# Patient Record
Sex: Female | Born: 1980 | Race: White | Hispanic: No | Marital: Married | Smoking: Never smoker
Health system: Southern US, Community
[De-identification: ages and names within clinical notes are randomized; demographics above are authoritative.]

## PROBLEM LIST (undated history)

## (undated) DIAGNOSIS — Z9289 Personal history of other medical treatment: Secondary | ICD-10-CM

## (undated) DIAGNOSIS — Z8742 Personal history of other diseases of the female genital tract: Secondary | ICD-10-CM

## (undated) DIAGNOSIS — Z6791 Unspecified blood type, Rh negative: Secondary | ICD-10-CM

## (undated) DIAGNOSIS — Z803 Family history of malignant neoplasm of breast: Secondary | ICD-10-CM

## (undated) DIAGNOSIS — N6019 Diffuse cystic mastopathy of unspecified breast: Secondary | ICD-10-CM

## (undated) DIAGNOSIS — N970 Female infertility associated with anovulation: Secondary | ICD-10-CM

## (undated) DIAGNOSIS — O26899 Other specified pregnancy related conditions, unspecified trimester: Secondary | ICD-10-CM

## (undated) DIAGNOSIS — N63 Unspecified lump in unspecified breast: Secondary | ICD-10-CM

## (undated) HISTORY — DX: Unspecified blood type, rh negative: Z67.91

## (undated) HISTORY — DX: Family history of malignant neoplasm of breast: Z80.3

## (undated) HISTORY — DX: Diffuse cystic mastopathy of unspecified breast: N60.19

## (undated) HISTORY — DX: Female infertility associated with anovulation: N97.0

## (undated) HISTORY — PX: NO PAST SURGERIES: SHX2092

## (undated) HISTORY — DX: Unspecified lump in unspecified breast: N63.0

## (undated) HISTORY — PX: WISDOM TOOTH EXTRACTION: SHX21

## (undated) HISTORY — DX: Personal history of other medical treatment: Z92.89

## (undated) HISTORY — DX: Other specified pregnancy related conditions, unspecified trimester: O26.899

## (undated) HISTORY — DX: Personal history of other diseases of the female genital tract: Z87.42

---

## 2008-04-15 ENCOUNTER — Encounter: Payer: Self-pay | Admitting: Maternal & Fetal Medicine

## 2010-08-29 ENCOUNTER — Ambulatory Visit: Payer: Self-pay | Admitting: Obstetrics and Gynecology

## 2011-07-03 DIAGNOSIS — N63 Unspecified lump in unspecified breast: Secondary | ICD-10-CM

## 2011-07-03 HISTORY — DX: Unspecified lump in unspecified breast: N63.0

## 2011-07-24 ENCOUNTER — Inpatient Hospital Stay: Payer: Self-pay

## 2011-07-24 LAB — CBC WITH DIFFERENTIAL/PLATELET
Basophil %: 0 %
Eosinophil #: 0 10*3/uL (ref 0.0–0.7)
Lymphocyte #: 1.2 10*3/uL (ref 1.0–3.6)
MCH: 31.8 pg (ref 26.0–34.0)
MCHC: 33.2 g/dL (ref 32.0–36.0)
MCV: 96 fL (ref 80–100)
Monocyte #: 0.7 10*3/uL (ref 0.0–0.7)
Platelet: 128 10*3/uL — ABNORMAL LOW (ref 150–440)
RBC: 3.71 10*6/uL — ABNORMAL LOW (ref 3.80–5.20)

## 2013-02-26 ENCOUNTER — Ambulatory Visit (INDEPENDENT_AMBULATORY_CARE_PROVIDER_SITE_OTHER): Payer: BC Managed Care – PPO | Admitting: General Surgery

## 2013-02-26 ENCOUNTER — Encounter: Payer: Self-pay | Admitting: General Surgery

## 2013-02-26 VITALS — BP 138/82 | HR 88 | Resp 12 | Ht 67.0 in | Wt 156.0 lb

## 2013-02-26 DIAGNOSIS — N6019 Diffuse cystic mastopathy of unspecified breast: Secondary | ICD-10-CM | POA: Insufficient documentation

## 2013-02-26 DIAGNOSIS — Z872 Personal history of diseases of the skin and subcutaneous tissue: Secondary | ICD-10-CM

## 2013-02-26 NOTE — Progress Notes (Signed)
Patient ID: Isabel Humphrey, female   DOB: Feb 23, 1981, 32 y.o.   MRN: [id number]  Chief Complaint  Patient presents with  . Follow-up    mammogram    HPI Isabel Humphrey is a 32 y.o. female who presents for a breast evaluation. Patient no new breast problems. Patient does perform regular self breast checks   HPI  Past Medical History  Diagnosis Date  . Diffuse cystic mastopathy     History reviewed. No pertinent past surgical history.  History reviewed. No pertinent family history.  Social History History  Substance Use Topics  . Smoking status: Never Smoker   . Smokeless tobacco: Never Used  . Alcohol Use: Yes    No Known Allergies  Current Outpatient Prescriptions  Medication Sig Dispense Refill  . fish oil-omega-3 fatty acids 1000 MG capsule Take 2 g by mouth daily.      Marland Kitchen LEVORA 0.15/30, 28, 0.15-30 MG-MCG tablet Take 1 tablet by mouth daily.      . Multiple Vitamins-Minerals (MULTIVITAMIN WITH MINERALS) tablet Take 1 tablet by mouth daily.       No current facility-administered medications for this visit.    Review of Systems Review of Systems  Constitutional: Negative.   Respiratory: Negative.   Cardiovascular: Negative.     Blood pressure 138/82, pulse 88, resp. rate 12, height 5\' 7"  (1.702 m), weight 156 lb (70.761 kg), last menstrual period 02/05/2013.  Physical Exam Physical Exam  Constitutional: She is oriented to person, place, and time. She appears well-developed and well-nourished.  Eyes: No scleral icterus.  Neck: Neck supple. No mass present.  Cardiovascular: Normal rate, regular rhythm and normal heart sounds.   Pulmonary/Chest: Breath sounds normal. Right breast exhibits no inverted nipple, no mass, no nipple discharge, no skin change and no tenderness. Left breast exhibits no inverted nipple, no mass, no nipple discharge, no skin change and no tenderness.  Abdominal: Bowel sounds are normal.  Lymphadenopathy:    She has no cervical  adenopathy.    She has no axillary adenopathy.  Neurological: She is alert and oriented to person, place, and time.  Skin: Skin is warm and dry.    Data Reviewed None   Assessment    Stable exam.    Plan      Patient to return one year.    Zeus Marquis G 02/27/2013, 6:28 AM

## 2013-02-26 NOTE — Patient Instructions (Addendum)
Patient to return in one year. Advised on monthly self exam.

## 2013-02-27 ENCOUNTER — Encounter: Payer: Self-pay | Admitting: General Surgery

## 2014-03-01 ENCOUNTER — Ambulatory Visit (INDEPENDENT_AMBULATORY_CARE_PROVIDER_SITE_OTHER): Payer: BC Managed Care – PPO | Admitting: General Surgery

## 2014-03-01 ENCOUNTER — Encounter: Payer: Self-pay | Admitting: General Surgery

## 2014-03-01 VITALS — BP 120/78 | HR 78 | Resp 12 | Ht 67.0 in | Wt 161.0 lb

## 2014-03-01 DIAGNOSIS — N6019 Diffuse cystic mastopathy of unspecified breast: Secondary | ICD-10-CM

## 2014-03-01 NOTE — Patient Instructions (Addendum)
Patient to return in one year office visit. Continue monthly self breast exam.

## 2014-03-01 NOTE — Progress Notes (Signed)
Patient ID: SHYLER HAMILL, female   DOB: 19-May-1981, 33 y.o.   MRN: [id number]  Chief Complaint  Patient presents with  . Follow-up    breast check     HPI GILBERTO STRECK is a 33 y.o. female here today for her yearly breast check. Patient states she is doing well. She has multiple breast cysts. HPI  Past Medical History  Diagnosis Date  . Diffuse cystic mastopathy     History reviewed. No pertinent past surgical history.  History reviewed. No pertinent family history.  Social History History  Substance Use Topics  . Smoking status: Never Smoker   . Smokeless tobacco: Never Used  . Alcohol Use: Yes    No Known Allergies  Current Outpatient Prescriptions  Medication Sig Dispense Refill  . fish oil-omega-3 fatty acids 1000 MG capsule Take 2 g by mouth daily.      Marland Kitchen LEVORA 0.15/30, 28, 0.15-30 MG-MCG tablet Take 1 tablet by mouth daily.      . Multiple Vitamins-Minerals (MULTIVITAMIN WITH MINERALS) tablet Take 1 tablet by mouth daily.       No current facility-administered medications for this visit.    Review of Systems Review of Systems  Constitutional: Negative.   Respiratory: Negative.   Cardiovascular: Negative.     Blood pressure 120/78, pulse 78, resp. rate 12, height  (1.702 m), weight 161 lb (73.029 kg).  Physical Exam Physical Exam  Constitutional: She is oriented to person, place, and time. She appears well-developed and well-nourished.  Eyes: Conjunctivae are normal. No scleral icterus.  Neck: Neck supple. No mass and no thyromegaly present.  Cardiovascular: Normal rate, regular rhythm and normal heart sounds.   Pulmonary/Chest: Right breast exhibits no inverted nipple, no mass, no nipple discharge, no skin change and no tenderness. Left breast exhibits no inverted nipple, no mass, no nipple discharge, no skin change and no tenderness.    Lymphadenopathy:    She has no cervical adenopathy.    She has no axillary adenopathy.  Neurological:  She is alert and oriented to person, place, and time.  Skin: Skin is warm and dry.    Data Reviewed None   Assessment    Stable exam. FCD      Plan    Patient to return in one year.         Armelia Penton G 03/02/2014, 4:26 PM

## 2014-03-02 ENCOUNTER — Encounter: Payer: Self-pay | Admitting: General Surgery

## 2014-05-03 ENCOUNTER — Encounter: Payer: Self-pay | Admitting: General Surgery

## 2014-11-09 NOTE — H&P (Signed)
L&D Evaluation:  History:   HPI 34 year old G1 P0 with EDC=07/29/2011 by LMP=10/20/2010 and 6wk ultrasound presents at 539 5/7 weeks with c/o onset ctxs at 4 PM tonight accompanied by bloody show. Contractions were becoming q2-3 min apart for about an hour before arrival. SROM for MSAF at 0025. [facility] for conceiving on Clomid, receiving Rhogam at 28 weeks (A neg), a normal anatomy scan, and being GBS negative.    Presents with contractions    Patient's Medical History No Chronic Illness    Patient's Surgical History none    Medications Pre Natal Vitamins    Allergies NKDA    Social History none    Family History Non-Contributory   ROS:   ROS see HPI   Exam:   Vital Signs 146/84    Urine Protein not completed    General breathing with contractions, involuntary pushing    Mental Status clear    Chest clear    Heart normal sinus rhythm, no murmur/gallop/rubs    Abdomen gravid, tender with contractions    Estimated Fetal Weight Average for gestational age    Fetal Position vtx    Edema no edema    Reflexes 2+    Pelvic no external lesions, 8/C/+1 on arrival    Mebranes Ruptured    Description green/meconium    FHT ?baseline 160s on arrival with active fetus, and ?decels to 90s with last three contractions    FHT Description mod variability    Ucx q2-3 min    Skin dry    Lymph no lymphadenopathy   Impression:   Impression IUP at 39 2/7 weeks in active labor with SROM , MSAF   Plan:   Plan EFM/NST, monitor contractions and for cervical change, Anticipate SAVD is imminent   Electronic Signatures: Trinna BalloonGutierrez, Stephanos Fan L (CNM)  (Signed 22-Jan-13 03:26)  Authored: L&D Evaluation   Last Updated: 22-Jan-13 03:26 by Trinna BalloonGutierrez, Zinnia Tindall L (CNM)

## 2014-12-29 ENCOUNTER — Encounter: Payer: Self-pay | Admitting: *Deleted

## 2015-02-24 ENCOUNTER — Ambulatory Visit (INDEPENDENT_AMBULATORY_CARE_PROVIDER_SITE_OTHER): Payer: BLUE CROSS/BLUE SHIELD | Admitting: General Surgery

## 2015-02-24 ENCOUNTER — Encounter: Payer: Self-pay | Admitting: General Surgery

## 2015-02-24 VITALS — BP 110/62 | HR 72 | Resp 14 | Ht 67.0 in | Wt 166.0 lb

## 2015-02-24 DIAGNOSIS — N6019 Diffuse cystic mastopathy of unspecified breast: Secondary | ICD-10-CM | POA: Diagnosis not present

## 2015-02-24 DIAGNOSIS — Z872 Personal history of diseases of the skin and subcutaneous tissue: Secondary | ICD-10-CM

## 2015-02-24 NOTE — Progress Notes (Signed)
Patient ID: Isabel Humphrey, female   DOB: [birth year], 34 y.o.   MRN: [id number]  Chief Complaint  Patient presents with  . Follow-up    HPI Isabel Humphrey is a 34 y.o. female.  who presents for a breast evaluation. Patient does perform regular self breast checks. No new breast issues.   HPI  Past Medical History  Diagnosis Date  . Diffuse cystic mastopathy     History reviewed. No pertinent past surgical history.  Family History  Problem Relation Age of Onset  . Thyroid cancer Mother   . Prostate cancer Paternal Uncle     with mets  . Breast cancer Paternal Grandmother   . Brain cancer Maternal Grandfather     Social History Social History  Substance Use Topics  . Smoking status: Never Smoker   . Smokeless tobacco: Never Used  . Alcohol Use: Yes    No Known Allergies  Current Outpatient Prescriptions  Medication Sig Dispense Refill  . fish oil-omega-3 fatty acids 1000 MG capsule Take 2 g by mouth daily.    Marland Kitchen LEVORA 0.15/30, 28, 0.15-30 MG-MCG tablet Take 1 tablet by mouth daily.    . Multiple Vitamins-Minerals (MULTIVITAMIN WITH MINERALS) tablet Take 1 tablet by mouth daily.     No current facility-administered medications for this visit.    Review of Systems Review of Systems  Constitutional: Negative.   Respiratory: Negative.   Cardiovascular: Negative.     Blood pressure 110/62, pulse 72, resp. rate 14, height  (1.702 m), weight 166 lb (75.297 kg), last menstrual period 02/18/2015.  Physical Exam Physical Exam  Constitutional: She is oriented to person, place, and time. She appears well-developed and well-nourished.  HENT:  Mouth/Throat: Oropharynx is clear and moist.  Eyes: Conjunctivae are normal. No scleral icterus.  Neck: Neck supple.  Cardiovascular: Normal rate, regular rhythm and normal heart sounds.   Pulmonary/Chest: Effort normal and breath sounds normal. Right breast exhibits no inverted nipple, no mass, no nipple discharge, no  skin change and no tenderness. Left breast exhibits no inverted nipple, no mass, no nipple discharge, no skin change and no tenderness.  Thickening superior aspect bilateral breast unchanged from before.  Abdominal: Soft. There is no tenderness.  Lymphadenopathy:    She has no cervical adenopathy.    She has no axillary adenopathy.  Neurological: She is alert and oriented to person, place, and time.  Skin: Skin is warm.  Psychiatric: Her behavior is normal.    Data Reviewed Progress notes.  Assessment    Stable physical exam, FCD and multiple breast cysts     Plan    Continue self breast exams. Call office for any new breast issues or concerns. Follow up in one year.      PCP: No Pcp Per Patient Ref: Frances Nickels 02/25/2015, 8:25 AM

## 2015-02-24 NOTE — Patient Instructions (Signed)
Continue self breast exams. Call office for any new breast issues or concerns. 

## 2015-02-25 ENCOUNTER — Encounter: Payer: Self-pay | Admitting: General Surgery

## 2016-02-27 ENCOUNTER — Encounter: Payer: Self-pay | Admitting: *Deleted

## 2016-03-01 ENCOUNTER — Ambulatory Visit (INDEPENDENT_AMBULATORY_CARE_PROVIDER_SITE_OTHER): Payer: BLUE CROSS/BLUE SHIELD | Admitting: General Surgery

## 2016-03-01 ENCOUNTER — Encounter: Payer: Self-pay | Admitting: General Surgery

## 2016-03-01 VITALS — BP 120/70 | HR 78 | Resp 14 | Ht 67.0 in | Wt 158.0 lb

## 2016-03-01 DIAGNOSIS — N6019 Diffuse cystic mastopathy of unspecified breast: Secondary | ICD-10-CM | POA: Diagnosis not present

## 2016-03-01 NOTE — Progress Notes (Signed)
Patient ID: Isabel QuantHannah M Lobb, female   DOB: October 08, 1980, 35 y.o.   MRN: [id number]  Chief Complaint  Patient presents with  . Follow-up    HPI Isabel Humphrey is a 35 y.o. female.  Here toady for follow up breast cysts. No new breast issues. She reports that she is doing well.  I have reviewed the history of present illness with the patient.   HPI  Past Medical History:  Diagnosis Date  . Diffuse cystic mastopathy     No past surgical history on file.  Family History  Problem Relation Age of Onset  . Thyroid cancer Mother   . Prostate cancer Paternal Uncle     with mets  . Breast cancer Paternal Grandmother   . Brain cancer Maternal Grandfather     Social History Social History  Substance Use Topics  . Smoking status: Never Smoker  . Smokeless tobacco: Never Used  . Alcohol use Yes    No Known Allergies  Current Outpatient Prescriptions  Medication Sig Dispense Refill  . fish oil-omega-3 fatty acids 1000 MG capsule Take 2 g by mouth daily.    Marland Kitchen. LEVORA 0.15/30, 28, 0.15-30 MG-MCG tablet Take 1 tablet by mouth daily.    . Multiple Vitamins-Minerals (MULTIVITAMIN WITH MINERALS) tablet Take 1 tablet by mouth daily.     No current facility-administered medications for this visit.     Review of Systems Review of Systems  Constitutional: Negative.   Respiratory: Negative.   Cardiovascular: Negative.     Blood pressure 120/70, pulse 78, resp. rate 14, height 5\' 7"  (1.702 m), weight 158 lb (71.7 kg), last menstrual period 02/09/2016.  Physical Exam Physical Exam  Constitutional: She is oriented to person, place, and time. She appears well-developed and well-nourished.  HENT:  Mouth/Throat: Oropharynx is clear and moist.  Eyes: Conjunctivae are normal. No scleral icterus.  Neck: Neck supple.  Cardiovascular: Normal rate, regular rhythm and normal heart sounds.   Pulmonary/Chest: Effort normal and breath sounds normal. Right breast exhibits no inverted nipple, no  mass, no nipple discharge, no skin change and no tenderness. Left breast exhibits no inverted nipple, no mass, no nipple discharge, no skin change and no tenderness.  Abdominal: Soft. Normal appearance and bowel sounds are normal. There is no hepatosplenomegaly. There is no tenderness.  Lymphadenopathy:    She has no cervical adenopathy.  Neurological: She is alert and oriented to person, place, and time.  Skin: Skin is warm and dry.  Psychiatric: Her behavior is normal.    Data Reviewed  Progress notes. Assessment    Stable physical exam, FCD and multiple breast cysts   Remote FH of breast cancer     Plan    Continue self breast exams. Call office for any new breast issues or concerns. Follow up in one year with a bilateral screening mammogram     This has been scribed by Sinda Duaryl-Lyn M Kennedy LPN    SANKAR,SEEPLAPUTHUR G 03/06/2016, 7:05 AM

## 2016-03-01 NOTE — Patient Instructions (Addendum)
Continue self breast exams. Call office for any new breast issues or concerns. Follow up in one year with a bilateral screening mammogram.

## 2016-03-06 ENCOUNTER — Encounter: Payer: Self-pay | Admitting: General Surgery

## 2016-07-02 DIAGNOSIS — Z9289 Personal history of other medical treatment: Secondary | ICD-10-CM

## 2016-07-02 HISTORY — DX: Personal history of other medical treatment: Z92.89

## 2016-12-17 ENCOUNTER — Other Ambulatory Visit: Payer: Self-pay

## 2016-12-17 DIAGNOSIS — Z1231 Encounter for screening mammogram for malignant neoplasm of breast: Secondary | ICD-10-CM

## 2017-02-14 ENCOUNTER — Ambulatory Visit
Admission: RE | Admit: 2017-02-14 | Discharge: 2017-02-14 | Disposition: A | Discharge status: Home / Self Care | Payer: BLUE CROSS/BLUE SHIELD | Source: Ambulatory Visit | Attending: General Surgery | Admitting: General Surgery

## 2017-02-14 DIAGNOSIS — Z1231 Encounter for screening mammogram for malignant neoplasm of breast: Secondary | ICD-10-CM | POA: Insufficient documentation

## 2017-02-18 ENCOUNTER — Ambulatory Visit (INDEPENDENT_AMBULATORY_CARE_PROVIDER_SITE_OTHER): Payer: BLUE CROSS/BLUE SHIELD | Admitting: General Surgery

## 2017-02-18 ENCOUNTER — Encounter: Payer: Self-pay | Admitting: General Surgery

## 2017-02-18 VITALS — BP 110/60 | HR 70 | Resp 14 | Ht 67.0 in | Wt 163.0 lb

## 2017-02-18 DIAGNOSIS — N6012 Diffuse cystic mastopathy of left breast: Secondary | ICD-10-CM

## 2017-02-18 DIAGNOSIS — N6011 Diffuse cystic mastopathy of right breast: Secondary | ICD-10-CM | POA: Diagnosis not present

## 2017-02-18 NOTE — Progress Notes (Signed)
Patient ID: Isabel Humphrey, female   DOB: 06-14-1981, 36 y.o.   MRN: [id number]  Chief Complaint  Patient presents with  . Follow-up    HPI Isabel Humphrey is a 36 y.o. female who presents for a breast evaluation. Her baseline mammogram was done on 02/14/2017. Patient does perform regular self breast checks and gets regular mammograms done. She reports no new breast concerns.   HPI  Past Medical History:  Diagnosis Date  . Diffuse cystic mastopathy     No past surgical history on file.  Family History  Problem Relation Age of Onset  . Thyroid cancer Mother   . Prostate cancer Paternal Uncle        with mets  . Breast cancer Paternal Grandmother   . Brain cancer Maternal Grandfather   . Breast cancer Other     Social History Social History  Substance Use Topics  . Smoking status: Never Smoker  . Smokeless tobacco: Never Used  . Alcohol use Yes    Allergies  Allergen Reactions  . Other Swelling    Walnuts, Pecans    Current Outpatient Prescriptions  Medication Sig Dispense Refill  . fish oil-omega-3 fatty acids 1000 MG capsule Take 2 g by mouth daily.    Marland Kitchen LEVORA 0.15/30, 28, 0.15-30 MG-MCG tablet Take 1 tablet by mouth daily.    . Multiple Vitamins-Minerals (MULTIVITAMIN WITH MINERALS) tablet Take 1 tablet by mouth daily.     No current facility-administered medications for this visit.     Review of Systems Review of Systems  Constitutional: Negative.   Respiratory: Negative.   Cardiovascular: Negative.     Blood pressure 110/60, pulse 70, resp. rate 14, height 5\' 7"  (1.702 m), weight 163 lb (73.9 kg), last menstrual period 01/24/2017.  Physical Exam Physical Exam  Constitutional: She is oriented to person, place, and time. She appears well-developed and well-nourished.  Eyes: Conjunctivae are normal. No scleral icterus.  Neck: Neck supple.  Cardiovascular: Normal rate, regular rhythm and normal heart sounds.   Pulmonary/Chest: Effort normal and  breath sounds normal. Right breast exhibits no inverted nipple, no mass, no nipple discharge, no skin change and no tenderness. Left breast exhibits no inverted nipple, no mass, no nipple discharge, no skin change and no tenderness.  Lymphadenopathy:    She has no cervical adenopathy.    She has no axillary adenopathy.  Neurological: She is alert and oriented to person, place, and time.  Skin: Skin is warm and dry.  Psychiatric: She has a normal mood and affect.    Data Reviewed Mammogram-normal   Assessment    History of multiple breast cysts. Remote family history of breast cancer. Exam is stable    Plan   advised on the need for yearly breast exams and our own monthly self breast exam Due for screening mammogram at age 69 with her GYN provider. Follow up as needed    HPI, Physical Exam, Assessment and Plan have been scribed under the direction and in the presence of Kathreen Cosier, MD  Ples Specter, CMA  I have completed the exam and reviewed the above documentation for accuracy and completeness.  I agree with the above.  Museum/gallery conservator has been used and any errors in dictation or transcription are unintentional.  Laurabeth Yip G. Evette Cristal, M.D., F.A.C.S.   Gerlene Burdock G 02/20/2017, 8:48 AM

## 2017-02-18 NOTE — Patient Instructions (Signed)
Continue self breast exams. Call office for any new breast issues or concerns. Screening mammogram at age 36

## 2017-04-12 ENCOUNTER — Ambulatory Visit (INDEPENDENT_AMBULATORY_CARE_PROVIDER_SITE_OTHER): Payer: BLUE CROSS/BLUE SHIELD | Admitting: Certified Nurse Midwife

## 2017-04-12 ENCOUNTER — Encounter: Payer: Self-pay | Admitting: Certified Nurse Midwife

## 2017-04-12 VITALS — BP 98/60 | HR 71 | Ht 67.0 in | Wt 161.0 lb

## 2017-04-12 DIAGNOSIS — Z01419 Encounter for gynecological examination (general) (routine) without abnormal findings: Secondary | ICD-10-CM | POA: Diagnosis not present

## 2017-04-12 DIAGNOSIS — Z124 Encounter for screening for malignant neoplasm of cervix: Secondary | ICD-10-CM

## 2017-04-12 DIAGNOSIS — Z3041 Encounter for surveillance of contraceptive pills: Secondary | ICD-10-CM | POA: Diagnosis not present

## 2017-04-12 MED ORDER — LEVONORGESTREL-ETHINYL ESTRAD 0.15-30 MG-MCG PO TABS: 1.0000 | ORAL_TABLET | Freq: Every day | ORAL | 3 refills | Status: DC

## 2017-04-12 NOTE — Progress Notes (Signed)
Gynecology Annual Exam  PCP: Patient, No Pcp Per  Chief Complaint:  Chief Complaint  Patient presents with  . Gynecologic Exam    History of Present Illness:Isabel Humphrey is a 36 year old Caucasian/White female, G1 P1001, who presents for her annual exam. She is having no significant GYN problems.  Her menses are regular and her LMP was 03/21/2017 . They occur every 28 days , they last 3-4 days , are light-medium flow, and are without clots.  She has had no spotting.  She denies dysmenorrhea.  The patient's past medical history is notable for fibrocystic breasts. She has been seeing Dr Evette Cristal yearly, but at her last appointment in August 2018, her exam was normal as was her mammogram and he advised her to have her breast exams at the time of her annual gyn exams. Her next mammogram will be at age 66. SHe has had no significant changes to her health since her last annual exam 04/06/2016.  She is sexually active. She is currently using birth control pills for contraception.  Her most recent pap smear was obtained 04/06/2016 and was NIL/negative HRHPV. Mammogram is not applicable.  There is a positive history of breast cancer in her paternal grandmother and maternal great grandmother. Genetic testing has not been done.  There is no family history of ovarian cancer.  The patient does do monthly self breast exams.  The patient does not smoke.  The patient does drink 1-2 glasses of wine /month.  The patient does not use illegal drugs.  The patient exercises by doing kickboxing 2x/week  The patient does get adequate calcium in her diet.  She  had a recent cholesterol screen 2017 which was normal (HDL a little low 39). The patient denies current symptoms of depression.    Review of Systems: Review of Systems  Constitutional: Negative for chills, fever and weight loss.  HENT: Negative for congestion, sinus pain and sore throat.   Eyes: Negative for blurred vision and pain.    Respiratory: Negative for hemoptysis, shortness of breath and wheezing.   Cardiovascular: Negative for chest pain, palpitations and leg swelling.  Gastrointestinal: Negative for abdominal pain, blood in stool, diarrhea, heartburn, nausea and vomiting.  Genitourinary: Negative for dysuria, frequency, hematuria and urgency.  Musculoskeletal: Negative for back pain, joint pain and myalgias.  Skin: Negative for itching and rash.  Neurological: Negative for dizziness, tingling and headaches.  Endo/Heme/Allergies: Negative for environmental allergies and polydipsia. Does not bruise/bleed easily.       Negative for hirsutism   Psychiatric/Behavioral: Negative for depression. The patient is not nervous/anxious and does not have insomnia.     Past Medical History:  Past Medical History:  Diagnosis Date  . Breast mass 2013   Breast cysts bilaterally  . Diffuse cystic mastopathy   . History of infertility   . History of mammogram 2018  . Infertility associated with anovulation    achieved pregnancy with Clomid  . Rh negative status during pregnancy     Past Surgical History:  Past Surgical History:  Procedure Laterality Date  . NO PAST SURGERIES      Family History:  Family History  Problem Relation Age of Onset  . Diabetes Mother   . Cancer Mother 94       Parathyroid  . Prostate cancer Paternal Uncle 23       with mets to liver, lung, and bone  . Breast cancer Paternal Grandmother 28  . Brain cancer Maternal  Grandfather 72  . Breast cancer Other   . Diabetes Father   . Spinal muscular atrophy Maternal Uncle     Social History:  Social History   Social History  . Marital status: Married    Spouse name: N/A  . Number of children: 1  . Years of education: N/A   Occupational History  . Dental Assistant    Social History Main Topics  . Smoking status: Never Smoker  . Smokeless tobacco: Never Used  . Alcohol use Yes     Comment: occ glass of wine  . Drug use: No  .  Sexual activity: Yes    Partners: Male    Birth control/ protection: Pill   Other Topics Concern  . Not on file   Social History Narrative  . No narrative on file    Allergies:  Allergies  Allergen Reactions  . Other Swelling    Walnuts, Pecans    Medications: Prior to Admission medications   Medication Sig Start Date End Date Taking? Authorizing Provider  fish oil-omega-3 fatty acids 1000 MG capsule Take 2 g by mouth daily.    [provider]  LEVORA 0.15/30, 28, 0.15-30 MG-MCG tablet Take 1 tablet by mouth daily. 02/13/13   [provider]  Multiple Vitamins-Minerals (MULTIVITAMIN WITH MINERALS) tablet Take 1 tablet by mouth daily.    [provider]    Physical Exam Vitals: BP 98/60   Pulse 71   Ht  (1.702 m)   Wt 161 lb (73 kg)   LMP 03/21/2017 (Exact Date)   BMI 25.22 kg/m .  General: WF in NAD HEENT: normocephalic, anicteric Neck: no thyroid enlargement, no palpable nodules, no cervical lymphadenopathy  Pulmonary: No increased work of breathing, CTAB Cardiovascular: RRR, without murmur  Breast: Breast symmetrical, no tenderness, no palpable nodules or masses, no skin or nipple retraction present, no nipple discharge.  No axillary, infraclavicular or supraclavicular lymphadenopathy. Abdomen: Soft, non-tender, non-distended.  Umbilicus without lesions.  No hepatomegaly or masses palpable. No evidence of hernia. Genitourinary:  External: Normal external female genitalia.  Normal urethral meatus, normal Bartholin's and Skene's glands.    Vagina: Normal vaginal mucosa, no evidence of prolapse.    Cervix: Grossly normal in appearance, no bleeding, non-tender  Uterus: Anteflexed, normal size, shape, and consistency, mobile, and non-tender  Adnexa: No adnexal masses, non-tender  Rectal: deferred  Lymphatic: no evidence of inguinal lymphadenopathy Extremities: no edema, erythema, or tenderness Neurologic: Grossly intact Psychiatric: mood  appropriate, affect full     Assessment: 36 y.o. G1P1 annual gyn exam  Plan:   1) Breast cancer screening - recommend monthly self breast exam. Next mammogram due at age 57.  2) Cervical cancer screening - Pap was done.  3) Contraception - RX for [employer] #3/RF x3 sent to pharmacy  4) Routine healthcare maintenance including cholesterol and diabetes screening UTD   5) RTO in 1 year and prn  Farrel Conners, CNM

## 2017-04-13 ENCOUNTER — Encounter: Payer: Self-pay | Admitting: Certified Nurse Midwife

## 2017-04-13 LAB — IGP,RFX APTIMA HPV ALL PTH: PAP Smear Comment: 0

## 2018-03-07 ENCOUNTER — Other Ambulatory Visit: Payer: Self-pay | Admitting: Certified Nurse Midwife

## 2018-04-25 ENCOUNTER — Ambulatory Visit (INDEPENDENT_AMBULATORY_CARE_PROVIDER_SITE_OTHER): Payer: BLUE CROSS/BLUE SHIELD | Admitting: Certified Nurse Midwife

## 2018-04-25 ENCOUNTER — Other Ambulatory Visit (HOSPITAL_COMMUNITY)
Admission: RE | Admit: 2018-04-25 | Discharge: 2018-04-25 | Disposition: A | Discharge status: Home / Self Care | Payer: BLUE CROSS/BLUE SHIELD | Source: Ambulatory Visit | Attending: Obstetrics and Gynecology | Admitting: Obstetrics and Gynecology

## 2018-04-25 ENCOUNTER — Encounter: Payer: Self-pay | Admitting: Certified Nurse Midwife

## 2018-04-25 VITALS — BP 110/70 | HR 66 | Ht 67.0 in | Wt 166.0 lb

## 2018-04-25 DIAGNOSIS — Z01419 Encounter for gynecological examination (general) (routine) without abnormal findings: Secondary | ICD-10-CM

## 2018-04-25 DIAGNOSIS — Z124 Encounter for screening for malignant neoplasm of cervix: Secondary | ICD-10-CM

## 2018-04-25 MED ORDER — LEVONORGESTREL-ETHINYL ESTRAD 0.15-30 MG-MCG PO TABS: 1.0000 | ORAL_TABLET | Freq: Every day | ORAL | 3 refills | Status: DC

## 2018-04-25 NOTE — Progress Notes (Addendum)
Gynecology Annual Exam  PCP: Patient, No Pcp Per  Chief Complaint:  Chief Complaint  Patient presents with  . Gynecologic Exam    History of Present Illness:Isabel Humphrey is a 37 year old Caucasian/White female, G1 P1001, who presents for her annual exam. She is having no significant GYN problems.  Her menses are regular and her LMP was 04/11/2018 . They occur every 28 days , they last 4 days , are light-medium flow, and are without clots.  She has had no spotting.  She denies dysmenorrhea.  The patient's past medical history is notable for fibrocystic breasts. She has been seeing Dr Evette Cristal yearly, but at her last appointment in August 2018, her exam was normal as was her mammogram and he advised her to have her breast exams at the time of her annual gyn exams. Her next mammogram will be at age 27. SHe has had no significant changes to her health since her last annual exam 04/12/2017.  She is sexually active. She is currently using birth control pills for contraception.  Her most recent pap smear was obtained 04/12/2017 and was NIL. Her last mammogram as 02/10/2017 and was negative. There is a positive history of breast cancer in her paternal grandmother and maternal great grandmother. Genetic testing has not been done.  There is no family history of ovarian cancer.  The patient does do monthly self breast exams.  The patient does not smoke.  The patient does drink 3-4 glasses of wine /month.  The patient does not use illegal drugs.  The patient was exerciing by doing kickboxing 2x/week, but she is now started doing strength training The patient does get adequate calcium in her diet.  She  had a recent cholesterol screen 2017 which was normal (HDL a little low 39). The patient denies current symptoms of depression.    Review of Systems: Review of Systems  Constitutional: Negative for chills, fever and weight loss.  HENT: Negative for congestion, sinus pain and sore throat.    Eyes: Negative for blurred vision and pain.  Respiratory: Negative for hemoptysis, shortness of breath and wheezing.   Cardiovascular: Negative for chest pain, palpitations and leg swelling.  Gastrointestinal: Negative for abdominal pain, blood in stool, diarrhea, heartburn, nausea and vomiting.  Genitourinary: Negative for dysuria, frequency, hematuria and urgency.  Musculoskeletal: Negative for back pain, joint pain and myalgias.  Skin: Negative for itching and rash.  Neurological: Negative for dizziness, tingling and headaches.  Endo/Heme/Allergies: Negative for environmental allergies and polydipsia. Does not bruise/bleed easily.       Negative for hirsutism   Psychiatric/Behavioral: Negative for depression. The patient is not nervous/anxious and does not have insomnia.     Past Medical History:  Past Medical History:  Diagnosis Date  . Breast mass 2013   Breast cysts bilaterally  . Diffuse cystic mastopathy   . History of infertility   . History of mammogram 2018  . Infertility associated with anovulation    achieved pregnancy with Clomid  . Rh negative status during pregnancy     Past Surgical History:  Past Surgical History:  Procedure Laterality Date  . NO PAST SURGERIES      Family History:  Family History  Problem Relation Age of Onset  . Diabetes Mother   . Cancer Mother 63       Parathyroid  . Prostate cancer Paternal Uncle 15       with mets to liver, lung, and bone  .  Breast cancer Paternal Grandmother 26  . Brain cancer Maternal Grandfather 72  . Breast cancer Other   . Diabetes Father   . Prostate cancer Father 57  . Spinal muscular atrophy Maternal Uncle     Social History:  Social History   Socioeconomic History  . Marital status: Married    Spouse name: Not on file  . Number of children: 1  . Years of education: 69  . Highest education level: Not on file  Occupational History  . Occupation: Sales executive  Social Needs  . Financial  resource strain: Not on file  . Food insecurity:    Worry: Not on file    Inability: Not on file  . Transportation needs:    Medical: Not on file    Non-medical: Not on file  Tobacco Use  . Smoking status: Never Smoker  . Smokeless tobacco: Never Used  Substance and Sexual Activity  . Alcohol use: Yes    Comment: occ glass of wine  . Drug use: No  . Sexual activity: Yes    Partners: Male    Birth control/protection: Pill  Lifestyle  . Physical activity:    Days per week: Not on file    Minutes per session: Not on file  . Stress: Not on file  Relationships  . Social connections:    Talks on phone: Not on file    Gets together: Not on file    Attends religious service: Not on file    Active member of club or organization: Not on file    Attends meetings of clubs or organizations: Not on file    Relationship status: Not on file  . Intimate partner violence:    Fear of current or ex partner: Not on file    Emotionally abused: Not on file    Physically abused: Not on file    Forced sexual activity: Not on file  Other Topics Concern  . Not on file  Social History Narrative  . Not on file    Allergies:  Allergies  Allergen Reactions  . Other Swelling    Walnuts, Pecans    Medications: Prior to Admission medications   Medication Sig Start Date End Date Taking? Authorizing Provider  fish oil-omega-3 fatty acids 1000 MG capsule Take 2 g by mouth daily.    [provider]  LEVORA 0.15/30, 28, 0.15-30 MG-MCG tablet Take 1 tablet by mouth daily. 02/13/13   [provider]  Multiple Vitamins-Minerals (MULTIVITAMIN WITH MINERALS) tablet Take 1 tablet by mouth daily.    [provider]    Physical Exam Vitals: BP 110/70   Pulse 66   Ht 5\' 7"  (1.702 m)   Wt 166 lb (75.3 kg)   LMP 04/11/2018   BMI 26.00 kg/m .  General: WF in NAD HEENT: normocephalic, anicteric Neck: no thyroid enlargement, no palpable nodules, no cervical lymphadenopathy    Pulmonary: No increased work of breathing, CTAB Cardiovascular: RRR, without murmur  Breast: Breast symmetrical, no tenderness, no palpable nodules or masses, no skin or nipple retraction present, no nipple discharge.  No axillary, infraclavicular or supraclavicular lymphadenopathy. Abdomen: Soft, non-tender, non-distended.  Umbilicus without lesions.  No hepatomegaly or masses palpable. No evidence of hernia. Genitourinary:  External: Normal external female genitalia.  Normal urethral meatus, normal Bartholin's and Skene's glands.    Vagina: Normal vaginal mucosa, no evidence of prolapse.    Cervix: Everted, friable with Pap smear, NT  Uterus: Anteflexed, normal size, shape, and consistency, mobile,  and non-tender  Adnexa: No adnexal masses, non-tender  Rectal: deferred  Lymphatic: no evidence of inguinal lymphadenopathy Extremities: no edema, erythema, or tenderness Neurologic: Grossly intact Psychiatric: mood appropriate, affect full     Assessment: 37 y.o. G1P1 annual gyn exam  Plan:   1) Breast cancer screening - recommend monthly self breast exam. Next mammogram due at age 6.  2) Cervical cancer screening - Pap was done.  3) Contraception - RX for [employer] #3/RF x3 sent to pharmacy  4) Routine healthcare maintenance including cholesterol and diabetes screening UTD   5) RTO in 1 year and prn  Farrel Conners, CNM

## 2018-04-25 NOTE — Addendum Note (Signed)
Addended by: Farrel Conners on: 04/25/2018 10:09 AM   Modules accepted: Orders

## 2018-04-29 LAB — CYTOLOGY - PAP: Diagnosis: NEGATIVE

## 2019-04-20 ENCOUNTER — Other Ambulatory Visit: Payer: Self-pay | Admitting: Certified Nurse Midwife

## 2019-05-01 ENCOUNTER — Ambulatory Visit: Payer: BLUE CROSS/BLUE SHIELD | Admitting: Certified Nurse Midwife

## 2019-05-07 NOTE — Progress Notes (Signed)
Gynecology Annual Exam  PCP: Patient, No Pcp Per  Chief Complaint:  Chief Complaint  Patient presents with  . Gynecologic Exam    History of Present Illness:Traniyah M. Jodelle GreenWhitley is a 38 year old Caucasian/White female, G1 P1001, who presents for her annual exam. She is having no significant GYN problems.  Her menses are regular and her LMP was 04/24/2019 . They occur every 28 days , they last 3-4 days , are light-medium flow, and are without clots.  She has had no spotting.  She denies dysmenorrhea.  The patient's past medical history is notable for fibrocystic breasts. Iin the past, she had been seeing Dr Evette CristalSankar yearly, but in 2018 her exam was normal as was her mammogram and he advised her to have her breast exams at the time of her annual gyn exams. Her next mammogram will be at age 89. SHe has had no significant changes to her health since her last annual exam 04/25/2018  She is sexually active. She is currently using birth control pills for contraception.  Her most recent pap smear was obtained 04/25/2018 and was NIL. Her last mammogram as 02/10/2017 and was negative. There is a positive history of breast cancer in her paternal grandmother and maternal great grandmother. Genetic testing has not been done.  There is no family history of ovarian cancer.  The patient does do monthly self breast exams.  The patient does not smoke.  The patient does drink 2 glasses of wine /month.  The patient does not use illegal drugs.  The patient was exerciing by walking 2x/week during Covid The patient does get adequate calcium in her diet.  She  had a recent cholesterol screen 2017 which was normal (HDL a little low 39). The patient denies current symptoms of depression.    Review of Systems: Review of Systems  Constitutional: Negative for chills, fever and weight loss.  HENT: Negative for congestion, sinus pain and sore throat.   Eyes: Negative for blurred vision and pain.  Respiratory:  Negative for hemoptysis, shortness of breath and wheezing.   Cardiovascular: Negative for chest pain, palpitations and leg swelling.  Gastrointestinal: Negative for abdominal pain, blood in stool, diarrhea, heartburn, nausea and vomiting.  Genitourinary: Negative for dysuria, frequency, hematuria and urgency.  Musculoskeletal: Negative for back pain, joint pain and myalgias.  Skin: Negative for itching and rash.  Neurological: Negative for dizziness, tingling and headaches.  Endo/Heme/Allergies: Negative for environmental allergies and polydipsia. Does not bruise/bleed easily.       Negative for hirsutism   Psychiatric/Behavioral: Negative for depression. The patient is not nervous/anxious and does not have insomnia.     Past Medical History:  Past Medical History:  Diagnosis Date  . Breast mass 2013   Breast cysts bilaterally  . Diffuse cystic mastopathy   . History of infertility   . History of mammogram 2018  . Infertility associated with anovulation    achieved pregnancy with Clomid  . Rh negative status during pregnancy     Past Surgical History:  Past Surgical History:  Procedure Laterality Date  . NO PAST SURGERIES      Family History:  Family History  Problem Relation Age of Onset  . Diabetes Mother   . Cancer Mother [id number]       Parathyroid  . Prostate cancer Paternal Uncle 2859       with mets to liver, lung, and bone  . Breast cancer Paternal Grandmother 3655  . Brain cancer  Maternal Grandfather 6  . Breast cancer Other   . Diabetes Father   . Prostate cancer Father 92  . Spinal muscular atrophy Maternal Uncle     Social History:  Social History   Socioeconomic History  . Marital status: Married    Spouse name: Not on file  . Number of children: 1  . Years of education: 43  . Highest education level: Not on file  Occupational History  . Occupation: Art therapist  Social Needs  . Financial resource strain: Not on file  . Food insecurity    Worry:  Not on file    Inability: Not on file  . Transportation needs    Medical: Not on file    Non-medical: Not on file  Tobacco Use  . Smoking status: Never Smoker  . Smokeless tobacco: Never Used  Substance and Sexual Activity  . Alcohol use: Yes    Comment: occ glass of wine  . Drug use: No  . Sexual activity: Yes    Partners: Male    Birth control/protection: Pill  Lifestyle  . Physical activity    Days per week: Not on file    Minutes per session: Not on file  . Stress: Not on file  Relationships  . Social Herbalist on phone: Not on file    Gets together: Not on file    Attends religious service: Not on file    Active member of club or organization: Not on file    Attends meetings of clubs or organizations: Not on file    Relationship status: Not on file  . Intimate partner violence    Fear of current or ex partner: Not on file    Emotionally abused: Not on file    Physically abused: Not on file    Forced sexual activity: Not on file  Other Topics Concern  . Not on file  Social History Narrative  . Not on file    Allergies:  Allergies  Allergen Reactions  . Other Swelling    Walnuts, Pecans    Medications: Prior to Admission medications   Medication Sig Start Date End Date Taking? Authorizing Provider  fish oil-omega-3 fatty acids 1000 MG capsule Take 2 g by mouth daily.    [provider]  LEVORA 0.15/30, 28, 0.15-30 MG-MCG tablet Take 1 tablet by mouth daily. 02/13/13   [provider]  Multiple Vitamins-Minerals (MULTIVITAMIN WITH MINERALS) tablet Take 1 tablet by mouth daily.    [provider]    Physical Exam Vitals: BP 110/60   Pulse 89   Ht 5\' 7"  (1.702 m)   Wt 175 lb (79.4 kg)   LMP 04/24/2019 (Approximate)   BMI 27.41 kg/m .  General: WF in NAD HEENT: normocephalic, anicteric Neck: no thyroid enlargement, no palpable nodules, no cervical lymphadenopathy  Pulmonary: No increased work of breathing, CTAB  Cardiovascular: RRR, without murmur  Breast: Breast symmetrical, no tenderness, no palpable nodules or masses, no skin or nipple retraction present, no nipple discharge.  No axillary, infraclavicular or supraclavicular lymphadenopathy. Abdomen: Soft, non-tender, non-distended.  Umbilicus without lesions.  No hepatomegaly or masses palpable. No evidence of hernia. Genitourinary:  External: Normal external female genitalia.  Normal urethral meatus, normal Bartholin's and Skene's glands.    Vagina: Normal vaginal mucosa, no evidence of prolapse.    Cervix: Everted, friable with Pap smear, NT  Uterus: Anteflexed, normal size, shape, and consistency, mobile, and non-tender  Adnexa: No adnexal masses, non-tender  Rectal: deferred  Lymphatic: no evidence of inguinal lymphadenopathy Extremities: no edema, erythema, or tenderness Neurologic: Grossly intact Psychiatric: mood appropriate, affect full     Assessment: 38 y.o. G1P1 annual gyn exam  Plan:   1) Breast cancer screening - recommend monthly self breast exam. Next mammogram due at age 27.  2) Cervical cancer screening - Pap was done.(MDL)  3) Contraception - RX for [employer] #3/RF x3 sent to pharmacy  4) Routine healthcare maintenance including cholesterol and diabetes screening UTD   5) RTO in 1 year and prn  Farrel Conners, CNM

## 2019-05-08 ENCOUNTER — Other Ambulatory Visit: Payer: Self-pay

## 2019-05-08 ENCOUNTER — Encounter: Payer: Self-pay | Admitting: Certified Nurse Midwife

## 2019-05-08 ENCOUNTER — Ambulatory Visit (INDEPENDENT_AMBULATORY_CARE_PROVIDER_SITE_OTHER): Payer: BC Managed Care – PPO | Admitting: Certified Nurse Midwife

## 2019-05-08 VITALS — BP 110/60 | HR 89 | Ht 67.0 in | Wt 175.0 lb

## 2019-05-08 DIAGNOSIS — Z01419 Encounter for gynecological examination (general) (routine) without abnormal findings: Secondary | ICD-10-CM | POA: Diagnosis not present

## 2019-05-08 DIAGNOSIS — Z124 Encounter for screening for malignant neoplasm of cervix: Secondary | ICD-10-CM

## 2019-05-08 MED ORDER — LEVONORGESTREL-ETHINYL ESTRAD 0.15-30 MG-MCG PO TABS: 1.0000 | ORAL_TABLET | Freq: Every day | ORAL | 3 refills | Status: DC

## 2019-05-21 ENCOUNTER — Encounter: Payer: Self-pay | Admitting: Certified Nurse Midwife

## 2020-06-03 ENCOUNTER — Encounter: Payer: Self-pay | Admitting: Advanced Practice Midwife

## 2020-06-03 ENCOUNTER — Ambulatory Visit (INDEPENDENT_AMBULATORY_CARE_PROVIDER_SITE_OTHER): Payer: BC Managed Care – PPO | Admitting: Advanced Practice Midwife

## 2020-06-03 ENCOUNTER — Other Ambulatory Visit: Payer: Self-pay

## 2020-06-03 VITALS — BP 122/74 | Ht 67.0 in | Wt 180.0 lb

## 2020-06-03 DIAGNOSIS — Z3041 Encounter for surveillance of contraceptive pills: Secondary | ICD-10-CM | POA: Diagnosis not present

## 2020-06-03 DIAGNOSIS — Z0000 Encounter for general adult medical examination without abnormal findings: Secondary | ICD-10-CM

## 2020-06-03 MED ORDER — LEVONORGESTREL-ETHINYL ESTRAD 0.15-30 MG-MCG PO TABS: 1.0000 | ORAL_TABLET | Freq: Every day | ORAL | 3 refills | Status: DC

## 2020-06-03 NOTE — Patient Instructions (Signed)
Health Maintenance, Female Adopting a healthy lifestyle and getting preventive care are important in promoting health and wellness. Ask your health care provider about:  The right schedule for you to have regular tests and exams.  Things you can do on your own to prevent diseases and keep yourself healthy. What should I know about diet, weight, and exercise? Eat a healthy diet   Eat a diet that includes plenty of vegetables, fruits, low-fat dairy products, and lean protein.  Do not eat a lot of foods that are high in solid fats, added sugars, or sodium. Maintain a healthy weight Body mass index (BMI) is used to identify weight problems. It estimates body fat based on height and weight. Your health care provider can help determine your BMI and help you achieve or maintain a healthy weight. Get regular exercise Get regular exercise. This is one of the most important things you can do for your health. Most adults should:  Exercise for at least 150 minutes each week. The exercise should increase your heart rate and make you sweat (moderate-intensity exercise).  Do strengthening exercises at least twice a week. This is in addition to the moderate-intensity exercise.  Spend less time sitting. Even light physical activity can be beneficial. Watch cholesterol and blood lipids Have your blood tested for lipids and cholesterol at 39 years of age, then have this test every 5 years. Have your cholesterol levels checked more often if:  Your lipid or cholesterol levels are high.  You are older than 40 years of age.  You are at high risk for heart disease. What should I know about cancer screening? Depending on your health history and family history, you may need to have cancer screening at various ages. This may include screening for:  Breast cancer.  Cervical cancer.  Colorectal cancer.  Skin cancer.  Lung cancer. What should I know about heart disease, diabetes, and high blood  pressure? Blood pressure and heart disease  High blood pressure causes heart disease and increases the risk of stroke. This is more likely to develop in people who have high blood pressure readings, are of African descent, or are overweight.  Have your blood pressure checked: ? Every 3-5 years if you are 18-39 years of age. ? Every year if you are 40 years old or older. Diabetes Have regular diabetes screenings. This checks your fasting blood sugar level. Have the screening done:  Once every three years after age 40 if you are at a normal weight and have a low risk for diabetes.  More often and at a younger age if you are overweight or have a high risk for diabetes. What should I know about preventing infection? Hepatitis B If you have a higher risk for hepatitis B, you should be screened for this virus. Talk with your health care provider to find out if you are at risk for hepatitis B infection. Hepatitis C Testing is recommended for:  Everyone born from 1945 through 1965.  Anyone with known risk factors for hepatitis C. Sexually transmitted infections (STIs)  Get screened for STIs, including gonorrhea and chlamydia, if: ? You are sexually active and are younger than 39 years of age. ? You are older than 39 years of age and your health care provider tells you that you are at risk for this type of infection. ? Your sexual activity has changed since you were last screened, and you are at increased risk for chlamydia or gonorrhea. Ask your health care provider if   you are at risk.  Ask your health care provider about whether you are at high risk for HIV. Your health care provider may recommend a prescription medicine to help prevent HIV infection. If you choose to take medicine to prevent HIV, you should first get tested for HIV. You should then be tested every 3 months for as long as you are taking the medicine. Pregnancy  If you are about to stop having your period (premenopausal) and  you may become pregnant, seek counseling before you get pregnant.  Take 400 to 800 micrograms (mcg) of folic acid every day if you become pregnant.  Ask for birth control (contraception) if you want to prevent pregnancy. Osteoporosis and menopause Osteoporosis is a disease in which the bones lose minerals and strength with aging. This can result in bone fractures. If you are 65 years old or older, or if you are at risk for osteoporosis and fractures, ask your health care provider if you should:  Be screened for bone loss.  Take a calcium or vitamin D supplement to lower your risk of fractures.  Be given hormone replacement therapy (HRT) to treat symptoms of menopause. Follow these instructions at home: Lifestyle  Do not use any products that contain nicotine or tobacco, such as cigarettes, e-cigarettes, and chewing tobacco. If you need help quitting, ask your health care provider.  Do not use street drugs.  Do not share needles.  Ask your health care provider for help if you need support or information about quitting drugs. Alcohol use  Do not drink alcohol if: ? Your health care provider tells you not to drink. ? You are pregnant, may be pregnant, or are planning to become pregnant.  If you drink alcohol: ? Limit how much you use to 0-1 drink a day. ? Limit intake if you are breastfeeding.  Be aware of how much alcohol is in your drink. In the [place] one drink equals one 12 oz bottle of beer (355 mL), one 5 oz glass of wine (148 mL), or one 1 oz glass of hard liquor (44 mL). General instructions  Schedule regular health, dental, and eye exams.  Stay current with your vaccines.  Tell your health care provider if: ? You often feel depressed. ? You have ever been abused or do not feel safe at home. Summary  Adopting a healthy lifestyle and getting preventive care are important in promoting health and wellness.  Follow your health care provider's instructions about healthy  diet, exercising, and getting tested or screened for diseases.  Follow your health care provider's instructions on monitoring your cholesterol and blood pressure. This information is not intended to replace advice given to you by your health care provider. Make sure you discuss any questions you have with your health care provider. Document Revised: 06/11/2018 Document Reviewed: 06/11/2018 Elsevier Patient Education  2020 [employer]  

## 2020-06-03 NOTE — Progress Notes (Signed)
Gynecology Annual Exam   PCP: Patient, No Pcp Per  Chief Complaint:  Chief Complaint  Patient presents with  . Annual Exam    History of Present Illness: Patient is a 39 y.o. G1P1001 presents for annual exam. The patient has no complaints today.   LMP: Patient's last menstrual period was 05/04/2020. Average Interval: regular, 28 days Duration of flow: 3-4 days Heavy Menses: no Clots: no Intermenstrual Bleeding: no Postcoital Bleeding: no Dysmenorrhea: no  The patient is sexually active. She currently uses OCP (estrogen/progesterone) for contraception. She denies dyspareunia.  The patient does perform self breast exams.  There is no notable family history of breast or ovarian cancer in her family.  The patient wears seatbelts: yes.   The patient has regular exercise: she walks regularly. She admits healthy diet, adequate hydration and adequate sleep.    The patient denies current symptoms of depression.    Review of Systems: Review of Systems  Constitutional: Negative for chills and fever.  HENT: Negative for congestion, ear discharge, ear pain, hearing loss, sinus pain and sore throat.   Eyes: Negative for blurred vision and double vision.  Respiratory: Negative for cough, shortness of breath and wheezing.   Cardiovascular: Negative for chest pain, palpitations and leg swelling.  Gastrointestinal: Negative for abdominal pain, blood in stool, constipation, diarrhea, heartburn, melena, nausea and vomiting.  Genitourinary: Negative for dysuria, flank pain, frequency, hematuria and urgency.  Musculoskeletal: Negative for back pain, joint pain and myalgias.  Skin: Negative for itching and rash.  Neurological: Negative for dizziness, tingling, tremors, sensory change, speech change, focal weakness, seizures, loss of consciousness, weakness and headaches.  Endo/Heme/Allergies: Negative for environmental allergies. Does not bruise/bleed easily.  Psychiatric/Behavioral: Negative  for depression, hallucinations, memory loss, substance abuse and suicidal ideas. The patient is not nervous/anxious and does not have insomnia.     Past Medical History:  Patient Active Problem List   Diagnosis Date Noted  . Fibrocystic breast 02/26/2013    Past Surgical History:  Past Surgical History:  Procedure Laterality Date  . NO PAST SURGERIES      Gynecologic History:  Patient's last menstrual period was 05/04/2020. Contraception: OCP (estrogen/progesterone) Last Pap: 1 year ago Results were: no abnormalities   Obstetric History: G1P1001  Family History:  Family History  Problem Relation Age of Onset  . Diabetes Mother   . Cancer Mother 72       Parathyroid  . Prostate cancer Paternal Uncle 56       with mets to liver, lung, and bone  . Breast cancer Paternal Grandmother 23  . Brain cancer Maternal Grandfather 72  . Breast cancer Other   . Diabetes Father   . Prostate cancer Father 80  . Spinal muscular atrophy Maternal Uncle     Social History:  Social History   Socioeconomic History  . Marital status: Married    Spouse name: Not on file  . Number of children: 1  . Years of education: 75  . Highest education level: Not on file  Occupational History  . Occupation: Sales executive  Tobacco Use  . Smoking status: Never Smoker  . Smokeless tobacco: Never Used  Vaping Use  . Vaping Use: Never used  Substance and Sexual Activity  . Alcohol use: Yes    Comment: occ glass of wine  . Drug use: No  . Sexual activity: Yes    Partners: Male    Birth control/protection: Pill  Other Topics Concern  . Not  on file  Social History Narrative  . Not on file   Social Determinants of Health   Financial Resource Strain:   . Difficulty of Paying Living Expenses: Not on file  Food Insecurity:   . Worried About Programme researcher, broadcasting/film/video in the Last Year: Not on file  . Ran Out of Food in the Last Year: Not on file  Transportation Needs:   . Lack of Transportation  (Medical): Not on file  . Lack of Transportation (Non-Medical): Not on file  Physical Activity:   . Days of Exercise per Week: Not on file  . Minutes of Exercise per Session: Not on file  Stress:   . Feeling of Stress : Not on file  Social Connections:   . Frequency of Communication with Friends and Family: Not on file  . Frequency of Social Gatherings with Friends and Family: Not on file  . Attends Religious Services: Not on file  . Active Member of Clubs or Organizations: Not on file  . Attends Banker Meetings: Not on file  . Marital Status: Not on file  Intimate Partner Violence:   . Fear of Current or Ex-Partner: Not on file  . Emotionally Abused: Not on file  . Physically Abused: Not on file  . Sexually Abused: Not on file    Allergies:  No Active Allergies  Medications: Prior to Admission medications   Medication Sig Start Date End Date Taking? Authorizing Provider  fish oil-omega-3 fatty acids 1000 MG capsule Take 2 g by mouth daily.   Yes [provider]  levonorgestrel-ethinyl estradiol (KURVELO) 0.15-30 MG-MCG tablet Take 1 tablet by mouth daily. 06/03/20  Yes Tresea Mall, CNM  Multiple Vitamins-Minerals (MULTIVITAMIN WITH MINERALS) tablet Take 1 tablet by mouth daily.   Yes [provider]    Physical Exam Vitals: Blood pressure 122/74, height 5\' 7"  (1.702 m), weight 180 lb (81.6 kg), last menstrual period 05/04/2020.  General: NAD HEENT: normocephalic, anicteric Thyroid: no enlargement, no palpable nodules Pulmonary: No increased work of breathing, CTAB Cardiovascular: RRR, distal pulses 2+ Breast: Breast symmetrical, no tenderness, no palpable nodules or masses, no skin or nipple retraction present, no nipple discharge.  No axillary or supraclavicular lymphadenopathy. Abdomen: NABS, soft, non-tender, non-distended.  Umbilicus without lesions.  No hepatomegaly, splenomegaly or masses palpable. No evidence of hernia    Genitourinary: deferred for no concerns and PAP interval Extremities: no edema, erythema, or tenderness Neurologic: Grossly intact Psychiatric: mood appropriate, affect full   Assessment: 39 y.o. G1P1001 routine annual exam  Plan: Problem List Items Addressed This Visit    None    Visit Diagnoses    Well woman exam without gynecological exam    -  Primary   Relevant Medications   levonorgestrel-ethinyl estradiol (KURVELO) 0.15-30 MG-MCG tablet   Encounter for surveillance of contraceptive pills       Relevant Medications   levonorgestrel-ethinyl estradiol (KURVELO) 0.15-30 MG-MCG tablet      1) STI screening  was offered and declined  2)  ASCCP guidelines and rationale discussed.  Patient opts for every 3 years screening interval  3) Contraception - the patient is currently using  OCP (estrogen/progesterone).  She is happy with her current form of contraception and plans to continue  4) Routine healthcare maintenance including cholesterol, diabetes screening discussed Declines  5) Return in about 1 year (around 06/03/2021) for annual established gyn.   14/08/2020, CNM [facility] 06/03/2020, 10:00 AM

## 2021-04-28 ENCOUNTER — Other Ambulatory Visit: Payer: Self-pay | Admitting: Advanced Practice Midwife

## 2021-04-28 DIAGNOSIS — Z1231 Encounter for screening mammogram for malignant neoplasm of breast: Secondary | ICD-10-CM

## 2021-04-30 ENCOUNTER — Other Ambulatory Visit: Payer: Self-pay | Admitting: Advanced Practice Midwife

## 2021-04-30 DIAGNOSIS — Z3041 Encounter for surveillance of contraceptive pills: Secondary | ICD-10-CM

## 2021-04-30 DIAGNOSIS — Z0000 Encounter for general adult medical examination without abnormal findings: Secondary | ICD-10-CM

## 2021-05-24 ENCOUNTER — Other Ambulatory Visit: Payer: Self-pay

## 2021-05-24 ENCOUNTER — Ambulatory Visit
Admission: RE | Admit: 2021-05-24 | Discharge: 2021-05-24 | Disposition: A | Discharge status: Home / Self Care | Payer: BC Managed Care – PPO | Source: Ambulatory Visit | Attending: Advanced Practice Midwife | Admitting: Advanced Practice Midwife

## 2021-05-24 DIAGNOSIS — Z1231 Encounter for screening mammogram for malignant neoplasm of breast: Secondary | ICD-10-CM | POA: Diagnosis not present

## 2021-06-28 ENCOUNTER — Ambulatory Visit (INDEPENDENT_AMBULATORY_CARE_PROVIDER_SITE_OTHER): Payer: BC Managed Care – PPO | Admitting: Advanced Practice Midwife

## 2021-06-28 ENCOUNTER — Other Ambulatory Visit: Payer: Self-pay

## 2021-06-28 ENCOUNTER — Encounter: Payer: Self-pay | Admitting: Advanced Practice Midwife

## 2021-06-28 VITALS — BP 120/80 | Ht 67.0 in | Wt 182.0 lb

## 2021-06-28 DIAGNOSIS — Z3041 Encounter for surveillance of contraceptive pills: Secondary | ICD-10-CM

## 2021-06-28 DIAGNOSIS — Z0000 Encounter for general adult medical examination without abnormal findings: Secondary | ICD-10-CM | POA: Diagnosis not present

## 2021-06-28 DIAGNOSIS — Z130 Encounter for screening for diseases of the blood and blood-forming organs and certain disorders involving the immune mechanism: Secondary | ICD-10-CM | POA: Diagnosis not present

## 2021-06-28 DIAGNOSIS — Z13220 Encounter for screening for lipoid disorders: Secondary | ICD-10-CM | POA: Diagnosis not present

## 2021-06-28 DIAGNOSIS — Z131 Encounter for screening for diabetes mellitus: Secondary | ICD-10-CM

## 2021-06-28 MED ORDER — LEVONORGESTREL-ETHINYL ESTRAD 0.15-30 MG-MCG PO TABS: 1.0000 | ORAL_TABLET | Freq: Every day | ORAL | 3 refills | Status: DC

## 2021-06-28 NOTE — Progress Notes (Signed)
Gynecology Annual Exam  PCP: Patient, No Pcp Per (Inactive)  Chief Complaint:  Chief Complaint  Patient presents with   Annual Exam    History of Present Illness: Patient is a 40 y.o. G1P1001 presents for annual exam. The patient has no complaints today.   LMP: Patient's last menstrual period was 06/23/2021. Average Interval: regular, 28 days Duration of flow:  3-4  days Heavy Menses: sometimes Clots: no Intermenstrual Bleeding: no Postcoital Bleeding: no Dysmenorrhea: no   The patient is sexually active. She currently uses OCP (estrogen/progesterone) for contraception. She denies dyspareunia.  The patient does perform self breast exams.  There is no notable family history of breast or ovarian cancer in her family.  The patient wears seatbelts: yes.   The patient has regular exercise:  she walks regularly and has an active lifestyle. She admits healthy lifestyle diet, hydration, sleep .    The patient denies current symptoms of depression.    Review of Systems: Review of Systems  Constitutional:  Negative for chills and fever.  HENT:  Negative for congestion, ear discharge, ear pain, hearing loss, sinus pain and sore throat.   Eyes:  Negative for blurred vision and double vision.  Respiratory:  Negative for cough, shortness of breath and wheezing.   Cardiovascular:  Negative for chest pain, palpitations and leg swelling.  Gastrointestinal:  Negative for abdominal pain, blood in stool, constipation, diarrhea, heartburn, melena, nausea and vomiting.  Genitourinary:  Negative for dysuria, flank pain, frequency, hematuria and urgency.  Musculoskeletal:  Negative for back pain, joint pain and myalgias.  Skin:  Negative for itching and rash.  Neurological:  Negative for dizziness, tingling, tremors, sensory change, speech change, focal weakness, seizures, loss of consciousness, weakness and headaches.  Endo/Heme/Allergies:  Negative for environmental allergies. Does not  bruise/bleed easily.  Psychiatric/Behavioral:  Negative for depression, hallucinations, memory loss, substance abuse and suicidal ideas. The patient is not nervous/anxious and does not have insomnia.    Past Medical History:  Patient Active Problem List   Diagnosis Date Noted   Fibrocystic breast 02/26/2013    Past Surgical History:  Past Surgical History:  Procedure Laterality Date   NO PAST SURGERIES      Gynecologic History:  Patient's last menstrual period was 06/23/2021. Contraception: OCP (estrogen/progesterone) Last Pap: 2 years ago Results were:  no abnormalities  Last mammogram: 1 month ago Results were: BI-RAD I  Obstetric History: G1P1001  Family History:  Family History  Problem Relation Age of Onset   Diabetes Mother    Cancer Mother 30       Parathyroid   Prostate cancer Paternal Uncle 27       with mets to liver, lung, and bone   Breast cancer Paternal Grandmother 52   Brain cancer Maternal Grandfather 72   Breast cancer Other    Diabetes Father    Prostate cancer Father 58   Spinal muscular atrophy Maternal Uncle     Social History:  Social History   Socioeconomic History   Marital status: Married    Spouse name: Not on file   Number of children: 1   Years of education: 14   Highest education level: Not on file  Occupational History   Occupation: Art therapist  Tobacco Use   Smoking status: Never   Smokeless tobacco: Never  Vaping Use   Vaping Use: Never used  Substance and Sexual Activity   Alcohol use: Yes    Comment: occ glass of wine  Drug use: No   Sexual activity: Yes    Partners: Male    Birth control/protection: Pill  Other Topics Concern   Not on file  Social History Narrative   Not on file   Social Determinants of Health   Financial Resource Strain: Not on file  Food Insecurity: Not on file  Transportation Needs: Not on file  Physical Activity: Not on file  Stress: Not on file  Social Connections: Not on file   Intimate Partner Violence: Not on file    Allergies:  No Active Allergies  Medications: Prior to Admission medications   Medication Sig Start Date End Date Taking? Authorizing Provider  fish oil-omega-3 fatty acids 1000 MG capsule Take 2 g by mouth daily.   Yes [provider]  Multiple Vitamins-Minerals (MULTIVITAMIN WITH MINERALS) tablet Take 1 tablet by mouth daily.   Yes [provider]  levonorgestrel-ethinyl estradiol (KURVELO) 0.15-30 MG-MCG tablet Take 1 tablet by mouth daily. 06/28/21   Rod Can, CNM    Physical Exam Vitals: Blood pressure 120/80, height 5\' 7"  (1.702 m), weight 182 lb (82.6 kg), last menstrual period 06/23/2021.  General: NAD HEENT: normocephalic, anicteric Thyroid: no enlargement, no palpable nodules Pulmonary: No increased work of breathing, CTAB Cardiovascular: RRR, distal pulses 2+ Breast: Breast symmetrical, no tenderness, no palpable nodules or masses, no skin or nipple retraction present, no nipple discharge.  No axillary or supraclavicular lymphadenopathy. Abdomen: NABS, soft, non-tender, non-distended.  Umbilicus without lesions.  No hepatomegaly, splenomegaly or masses palpable. No evidence of hernia  Genitourinary: deferred for no concerns/PAP interval Extremities: no edema, erythema, or tenderness Neurologic: Grossly intact Psychiatric: mood appropriate, affect full   Assessment: 40 y.o. G1P1001 routine annual exam  Plan: Problem List Items Addressed This Visit   None Visit Diagnoses     Well woman exam without gynecological exam    -  Primary   Relevant Medications   levonorgestrel-ethinyl estradiol (KURVELO) 0.15-30 MG-MCG tablet   Other Relevant Orders   Hgb A1c w/o eAG   CBC with Differential/Platelet   Comprehensive metabolic panel   Lipid Panel With LDL/HDL Ratio   Encounter for surveillance of contraceptive pills       Relevant Medications   levonorgestrel-ethinyl estradiol (KURVELO) 0.15-30 MG-MCG  tablet   Lipid screening       Relevant Orders   Lipid Panel With LDL/HDL Ratio   Screening for iron deficiency anemia       Relevant Orders   CBC with Differential/Platelet   Screening for diabetes mellitus       Relevant Orders   Hgb A1c w/o eAG       1) Mammogram - recommend yearly screening mammogram.  Mammogram Is up to date  2) STI screening  was offered and declined  3) ASCCP guidelines and rationale discussed.  Patient opts for every 3 years screening interval  4) Contraception - the patient is currently using  OCP (estrogen/progesterone).  She is happy with her current form of contraception and plans to continue  5) Colonoscopy -- Screening recommended starting at age 80 for average risk individuals, age 10 for individuals deemed at increased risk (including African Americans) and recommended to continue until age 33.  For patient age 77-85 individualized approach is recommended.  Gold standard screening is via colonoscopy, Cologuard screening is an acceptable alternative for patient unwilling or unable to undergo colonoscopy.  "Colorectal cancer screening for average?risk adults: 2018 guideline update from the American Cancer Society"[place]: A Cancer Journal for Clinicians: Nov 28, 2016   6) Routine healthcare maintenance including cholesterol, diabetes screening discussed Ordered today  7) Return in about 2 days (around 06/30/2021) for lab only visit in am.   Tresea Mall, CNM [facility] 06/28/2021, 1:30 PM

## 2021-06-30 ENCOUNTER — Other Ambulatory Visit: Payer: Self-pay

## 2021-06-30 ENCOUNTER — Other Ambulatory Visit: Payer: BC Managed Care – PPO

## 2021-06-30 DIAGNOSIS — Z0000 Encounter for general adult medical examination without abnormal findings: Secondary | ICD-10-CM

## 2021-06-30 DIAGNOSIS — Z13220 Encounter for screening for lipoid disorders: Secondary | ICD-10-CM

## 2021-06-30 DIAGNOSIS — Z131 Encounter for screening for diabetes mellitus: Secondary | ICD-10-CM

## 2021-06-30 DIAGNOSIS — Z130 Encounter for screening for diseases of the blood and blood-forming organs and certain disorders involving the immune mechanism: Secondary | ICD-10-CM

## 2021-07-01 LAB — HGB A1C W/O EAG: Hgb A1c MFr Bld: 5.4 % (ref 4.8–5.6)

## 2021-07-01 LAB — COMPREHENSIVE METABOLIC PANEL
ALT: 31 IU/L (ref 0–32)
AST: 25 IU/L (ref 0–40)
Albumin/Globulin Ratio: 1.6 (ref 1.2–2.2)
Albumin: 3.9 g/dL (ref 3.8–4.8)
Alkaline Phosphatase: 70 IU/L (ref 44–121)
BUN/Creatinine Ratio: 18 (ref 9–23)
BUN: 12 mg/dL (ref 6–24)
Bilirubin Total: 0.3 mg/dL (ref 0.0–1.2)
CO2: 25 mmol/L (ref 20–29)
Calcium: 8.8 mg/dL (ref 8.7–10.2)
Chloride: 104 mmol/L (ref 96–106)
Creatinine, Ser: 0.68 mg/dL (ref 0.57–1.00)
Globulin, Total: 2.4 g/dL (ref 1.5–4.5)
Glucose: 86 mg/dL (ref 70–99)
Potassium: 4.2 mmol/L (ref 3.5–5.2)
Sodium: 142 mmol/L (ref 134–144)
Total Protein: 6.3 g/dL (ref 6.0–8.5)
eGFR: 113 mL/min/{1.73_m2} (ref >59)

## 2021-07-01 LAB — CBC WITH DIFFERENTIAL/PLATELET
Basophils Absolute: 0.1 10*3/uL (ref 0.0–0.2)
Basos: 1 %
EOS (ABSOLUTE): 0.4 10*3/uL (ref 0.0–0.4)
Eos: 4 %
Hematocrit: 45 % (ref 34.0–46.6)
Hemoglobin: 14.4 g/dL (ref 11.1–15.9)
Immature Grans (Abs): 0.1 10*3/uL (ref 0.0–0.1)
Immature Granulocytes: 1 %
Lymphocytes Absolute: 2.7 10*3/uL (ref 0.7–3.1)
Lymphs: 30 %
MCH: 29.7 pg (ref 26.6–33.0)
MCHC: 32 g/dL (ref 31.5–35.7)
MCV: 93 fL (ref 79–97)
Monocytes Absolute: 0.5 10*3/uL (ref 0.1–0.9)
Monocytes: 5 %
Neutrophils Absolute: 5.3 10*3/uL (ref 1.4–7.0)
Neutrophils: 59 %
Platelets: 340 10*3/uL (ref 150–450)
RBC: 4.85 x10E6/uL (ref 3.77–5.28)
RDW: 11.8 % (ref 11.7–15.4)
WBC: 8.9 10*3/uL (ref 3.4–10.8)

## 2021-07-01 LAB — LIPID PANEL WITH LDL/HDL RATIO
Cholesterol, Total: 216 mg/dL — ABNORMAL HIGH (ref 100–199)
HDL: 47 mg/dL (ref >39)
LDL Chol Calc (NIH): 149 mg/dL — ABNORMAL HIGH (ref 0–99)
LDL/HDL Ratio: 3.2 ratio (ref 0.0–3.2)
Triglycerides: 111 mg/dL (ref 0–149)
VLDL Cholesterol Cal: 20 mg/dL (ref 5–40)

## 2022-05-25 ENCOUNTER — Other Ambulatory Visit: Payer: Self-pay | Admitting: Advanced Practice Midwife

## 2022-05-25 DIAGNOSIS — Z3041 Encounter for surveillance of contraceptive pills: Secondary | ICD-10-CM

## 2022-05-25 DIAGNOSIS — Z0000 Encounter for general adult medical examination without abnormal findings: Secondary | ICD-10-CM

## 2022-05-28 ENCOUNTER — Encounter: Payer: Self-pay | Admitting: Advanced Practice Midwife

## 2022-05-29 ENCOUNTER — Other Ambulatory Visit: Payer: Self-pay

## 2022-05-29 DIAGNOSIS — Z3041 Encounter for surveillance of contraceptive pills: Secondary | ICD-10-CM

## 2022-05-29 DIAGNOSIS — Z0000 Encounter for general adult medical examination without abnormal findings: Secondary | ICD-10-CM

## 2022-05-29 MED ORDER — LEVONORGESTREL-ETHINYL ESTRAD 0.15-30 MG-MCG PO TABS: 1.0000 | ORAL_TABLET | Freq: Every day | ORAL | 0 refills | Status: DC

## 2022-08-19 ENCOUNTER — Other Ambulatory Visit: Payer: Self-pay | Admitting: Advanced Practice Midwife

## 2022-08-19 DIAGNOSIS — Z3041 Encounter for surveillance of contraceptive pills: Secondary | ICD-10-CM

## 2022-08-19 DIAGNOSIS — Z0000 Encounter for general adult medical examination without abnormal findings: Secondary | ICD-10-CM

## 2022-08-27 NOTE — Progress Notes (Unsigned)
   PCP:  Patient, No Pcp Per   No chief complaint on file.    HPI:      Ms. Isabel Humphrey is a 42 y.o. G1P1001 whose LMP was No LMP recorded., presents today for her annual examination.  Her menses are regular every 28-30 days, lasting 4 days.  Dysmenorrhea {dysmen:716}. She {does:18564} have intermenstrual bleeding.  Sex activity: {sex active: 315163}.  Last Pap: 05/08/19  Results were: no abnormalities  Hx of STDs: {STD hx:14358}  Last mammogram: 05/24/21 Results were: normal--routine follow-up in 12 months There is no FH of breast cancer. There is no FH of ovarian cancer. The patient {does:18564} do self-breast exams.  Tobacco use: {tob:20664} Alcohol use: {Alcohol:11675} No drug use.  Exercise: {exercise:31265}  She {does:18564} get adequate calcium and Vitamin D in her diet. Borderlined lipids 12/22  Patient Active Problem List   Diagnosis Date Noted   Fibrocystic breast 02/26/2013    Past Surgical History:  Procedure Laterality Date   NO PAST SURGERIES      Family History  Problem Relation Age of Onset   Diabetes Mother    Cancer Mother 22       Parathyroid   Prostate cancer Paternal Uncle 47       with mets to liver, lung, and bone   Breast cancer Paternal Grandmother 73   Brain cancer Maternal Grandfather 72   Breast cancer Other    Diabetes Father    Prostate cancer Father 61   Spinal muscular atrophy Maternal Uncle     Social History   Socioeconomic History   Marital status: Married    Spouse name: Not on file   Number of children: 1   Years of education: 14   Highest education level: Not on file  Occupational History   Occupation: Art therapist  Tobacco Use   Smoking status: Never   Smokeless tobacco: Never  Vaping Use   Vaping Use: Never used  Substance and Sexual Activity   Alcohol use: Yes    Comment: occ glass of wine   Drug use: No   Sexual activity: Yes    Partners: Male    Birth control/protection: Pill  Other Topics  Concern   Not on file  Social History Narrative   Not on file   Social Determinants of Health   Financial Resource Strain: Not on file  Food Insecurity: Not on file  Transportation Needs: Not on file  Physical Activity: Not on file  Stress: Not on file  Social Connections: Not on file  Intimate Partner Violence: Not on file     Current Outpatient Medications:    fish oil-omega-3 fatty acids 1000 MG capsule, Take 2 g by mouth daily., Disp: , Rfl:    LEVORA 0.15/30, 28, 0.15-30 MG-MCG tablet, TAKE 1 TABLET BY MOUTH DAILY, Disp: 84 tablet, Rfl: 0   Multiple Vitamins-Minerals (MULTIVITAMIN WITH MINERALS) tablet, Take 1 tablet by mouth daily., Disp: , Rfl:      ROS:  Review of Systems BREAST: No symptoms   Objective: There were no vitals taken for this visit.   OBGyn Exam  Results: No results found for this or any previous visit (from the past 24 hour(s)).  Assessment/Plan: No diagnosis found.  No orders of the defined types were placed in this encounter.            GYN counsel {counseling: 16159}     F/U  No follow-ups on file.  [name], PA-C 08/27/2022 7:58 PM

## 2022-08-28 ENCOUNTER — Other Ambulatory Visit (HOSPITAL_COMMUNITY)
Admission: RE | Admit: 2022-08-28 | Discharge: 2022-08-28 | Disposition: A | Discharge status: Home / Self Care | Payer: Commercial Managed Care - PPO | Source: Ambulatory Visit | Attending: Obstetrics and Gynecology | Admitting: Obstetrics and Gynecology

## 2022-08-28 ENCOUNTER — Encounter: Payer: Self-pay | Admitting: Obstetrics and Gynecology

## 2022-08-28 ENCOUNTER — Ambulatory Visit (INDEPENDENT_AMBULATORY_CARE_PROVIDER_SITE_OTHER): Payer: Commercial Managed Care - PPO | Admitting: Obstetrics and Gynecology

## 2022-08-28 VITALS — BP 98/60 | Ht 67.0 in | Wt 181.0 lb

## 2022-08-28 DIAGNOSIS — Z01419 Encounter for gynecological examination (general) (routine) without abnormal findings: Secondary | ICD-10-CM | POA: Diagnosis not present

## 2022-08-28 DIAGNOSIS — Z3041 Encounter for surveillance of contraceptive pills: Secondary | ICD-10-CM

## 2022-08-28 DIAGNOSIS — Z1151 Encounter for screening for human papillomavirus (HPV): Secondary | ICD-10-CM

## 2022-08-28 DIAGNOSIS — Z0000 Encounter for general adult medical examination without abnormal findings: Secondary | ICD-10-CM

## 2022-08-28 DIAGNOSIS — Z1231 Encounter for screening mammogram for malignant neoplasm of breast: Secondary | ICD-10-CM

## 2022-08-28 DIAGNOSIS — E785 Hyperlipidemia, unspecified: Secondary | ICD-10-CM | POA: Insufficient documentation

## 2022-08-28 DIAGNOSIS — Z124 Encounter for screening for malignant neoplasm of cervix: Secondary | ICD-10-CM

## 2022-08-28 DIAGNOSIS — N921 Excessive and frequent menstruation with irregular cycle: Secondary | ICD-10-CM

## 2022-08-28 DIAGNOSIS — Z13220 Encounter for screening for lipoid disorders: Secondary | ICD-10-CM

## 2022-08-28 DIAGNOSIS — Z803 Family history of malignant neoplasm of breast: Secondary | ICD-10-CM | POA: Insufficient documentation

## 2022-08-28 MED ORDER — NORETHIN ACE-ETH ESTRAD-FE 1-20 MG-MCG PO TABS: 1.0000 | ORAL_TABLET | Freq: Every day | ORAL | 3 refills | Status: DC

## 2022-08-28 NOTE — Patient Instructions (Signed)
I value your feedback and you entrusting us with your care. If you get a [facility] patient survey, I would appreciate you taking the time to let us know about your experience today. Thank you!  [facility]: [phone]      

## 2022-08-31 ENCOUNTER — Other Ambulatory Visit: Payer: Commercial Managed Care - PPO

## 2022-08-31 DIAGNOSIS — Z0000 Encounter for general adult medical examination without abnormal findings: Secondary | ICD-10-CM

## 2022-08-31 DIAGNOSIS — E785 Hyperlipidemia, unspecified: Secondary | ICD-10-CM

## 2022-08-31 DIAGNOSIS — Z803 Family history of malignant neoplasm of breast: Secondary | ICD-10-CM

## 2022-08-31 DIAGNOSIS — Z9189 Other specified personal risk factors, not elsewhere classified: Secondary | ICD-10-CM

## 2022-08-31 DIAGNOSIS — Z1371 Encounter for nonprocreative screening for genetic disease carrier status: Secondary | ICD-10-CM

## 2022-08-31 DIAGNOSIS — Z13220 Encounter for screening for lipoid disorders: Secondary | ICD-10-CM

## 2022-08-31 HISTORY — DX: Encounter for nonprocreative screening for genetic disease carrier status: Z13.71

## 2022-08-31 HISTORY — DX: Other specified personal risk factors, not elsewhere classified: Z91.89

## 2022-09-01 LAB — COMPREHENSIVE METABOLIC PANEL
ALT: 25 IU/L (ref 0–32)
AST: 21 IU/L (ref 0–40)
Albumin/Globulin Ratio: 1.8 (ref 1.2–2.2)
Albumin: 4.2 g/dL (ref 3.9–4.9)
Alkaline Phosphatase: 65 IU/L (ref 44–121)
BUN/Creatinine Ratio: 20 (ref 9–23)
BUN: 15 mg/dL (ref 6–24)
Bilirubin Total: 0.5 mg/dL (ref 0.0–1.2)
CO2: 23 mmol/L (ref 20–29)
Calcium: 9 mg/dL (ref 8.7–10.2)
Chloride: 106 mmol/L (ref 96–106)
Creatinine, Ser: 0.74 mg/dL (ref 0.57–1.00)
Globulin, Total: 2.3 g/dL (ref 1.5–4.5)
Glucose: 92 mg/dL (ref 70–99)
Potassium: 4.2 mmol/L (ref 3.5–5.2)
Sodium: 143 mmol/L (ref 134–144)
Total Protein: 6.5 g/dL (ref 6.0–8.5)
eGFR: 104 mL/min/{1.73_m2} (ref >59)

## 2022-09-01 LAB — LIPID PANEL
Chol/HDL Ratio: 4.7 ratio — ABNORMAL HIGH (ref 0.0–4.4)
Cholesterol, Total: 205 mg/dL — ABNORMAL HIGH (ref 100–199)
HDL: 44 mg/dL (ref >39)
LDL Chol Calc (NIH): 141 mg/dL — ABNORMAL HIGH (ref 0–99)
Triglycerides: 111 mg/dL (ref 0–149)
VLDL Cholesterol Cal: 20 mg/dL (ref 5–40)

## 2022-09-03 LAB — CYTOLOGY - PAP
Comment: NEGATIVE
Diagnosis: NEGATIVE
Diagnosis: REACTIVE
High risk HPV: NEGATIVE

## 2022-09-07 ENCOUNTER — Ambulatory Visit
Admission: RE | Admit: 2022-09-07 | Discharge: 2022-09-07 | Disposition: A | Discharge status: Home / Self Care | Payer: Commercial Managed Care - PPO | Source: Ambulatory Visit | Attending: Obstetrics and Gynecology | Admitting: Obstetrics and Gynecology

## 2022-09-07 DIAGNOSIS — Z1231 Encounter for screening mammogram for malignant neoplasm of breast: Secondary | ICD-10-CM | POA: Insufficient documentation

## 2022-09-20 ENCOUNTER — Encounter: Payer: Self-pay | Admitting: Obstetrics and Gynecology

## 2022-10-11 ENCOUNTER — Telehealth: Payer: Self-pay | Admitting: Obstetrics and Gynecology

## 2022-10-11 NOTE — Telephone Encounter (Signed)
Pt aware of MyRisk results. IBIS=22.1%/riskscore=12%. Pt aware of recommendations of monthly SBE, yearly CBE and mammos, as well as scr breast MRI. Will call for MRI ref prn.   Patient understands these results only apply to her and her children, and this is not indicative of genetic testing results of her other family members. It is recommended that her other family members have genetic testing done.  Pt also understands negative genetic testing doesn't mean she will never get any of these cancers.   Hard copy mailed to pt. F/u prn.

## 2022-11-20 ENCOUNTER — Other Ambulatory Visit: Payer: Self-pay | Admitting: Advanced Practice Midwife

## 2022-11-20 DIAGNOSIS — Z0000 Encounter for general adult medical examination without abnormal findings: Secondary | ICD-10-CM

## 2022-11-20 DIAGNOSIS — Z3041 Encounter for surveillance of contraceptive pills: Secondary | ICD-10-CM

## 2023-05-12 IMAGING — MG MM DIGITAL SCREENING BILAT W/ TOMO AND CAD
8 series · 8 of 24 positions shown · non-contrast
Comparison: Previous exam(s).

CLINICAL DATA: Screening.

EXAM:
DIGITAL SCREENING BILATERAL MAMMOGRAM WITH TOMOSYNTHESIS AND CAD
TECHNIQUE: Bilateral screening digital craniocaudal and mediolateral oblique
mammograms were obtained. Bilateral screening digital breast
tomosynthesis was performed. The images were evaluated with
computer-aided detection.

[R CC synth-2D]
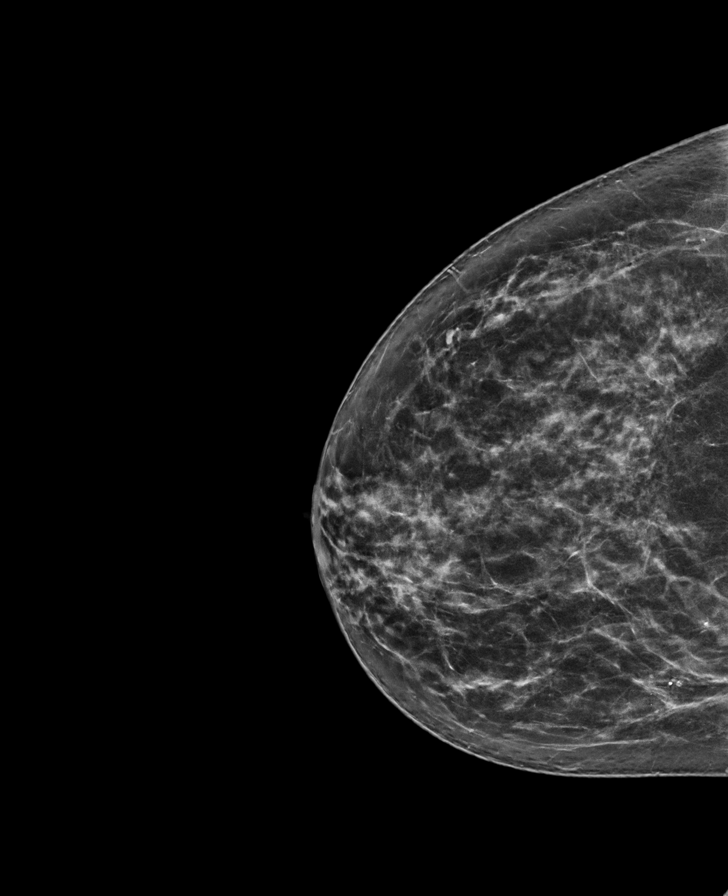

[R MLO synth-2D]
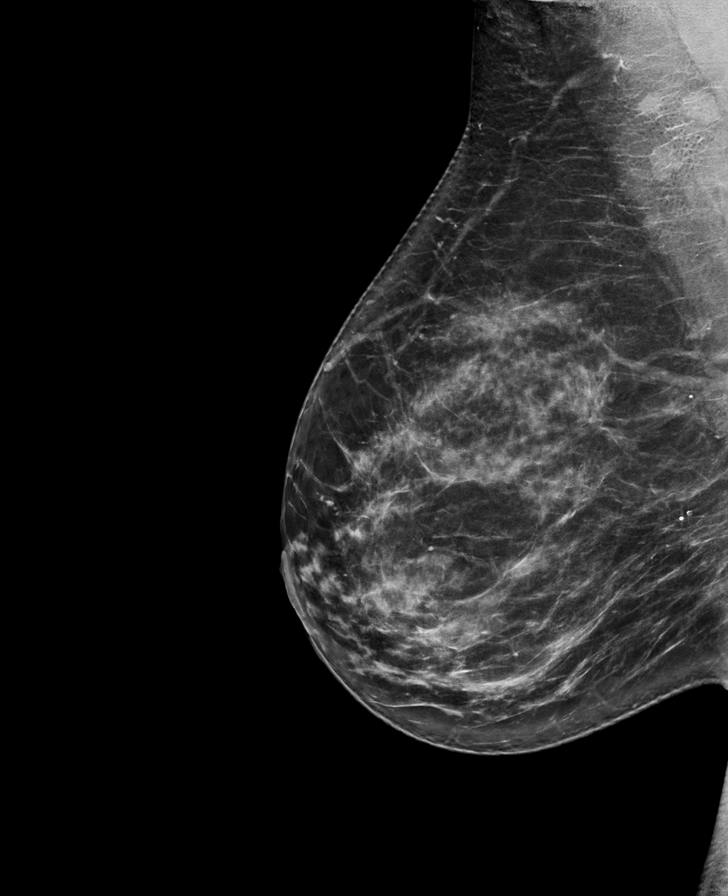

[L CC synth-2D]
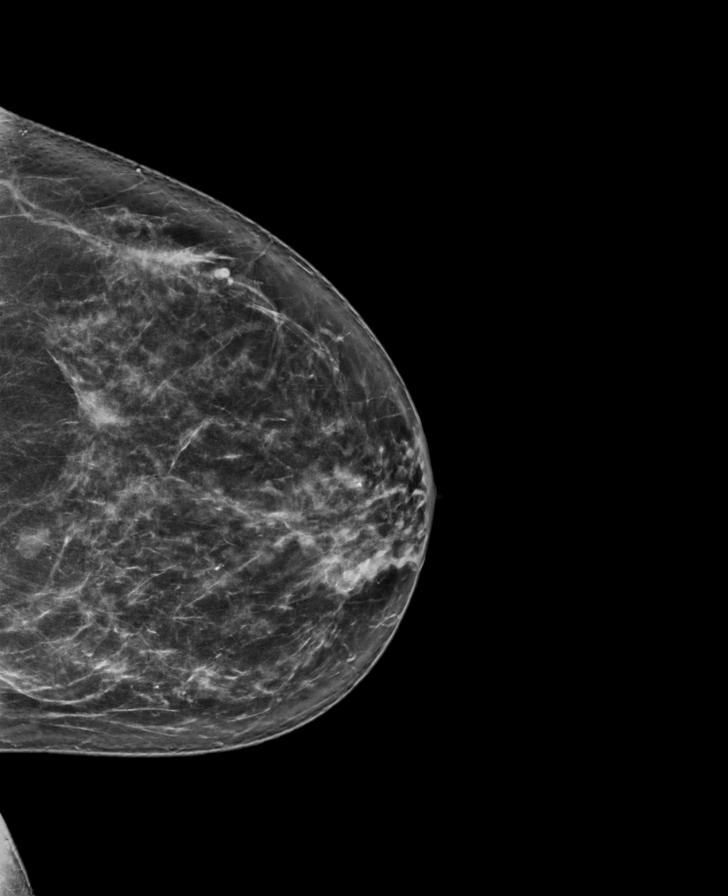

[L MLO synth-2D]
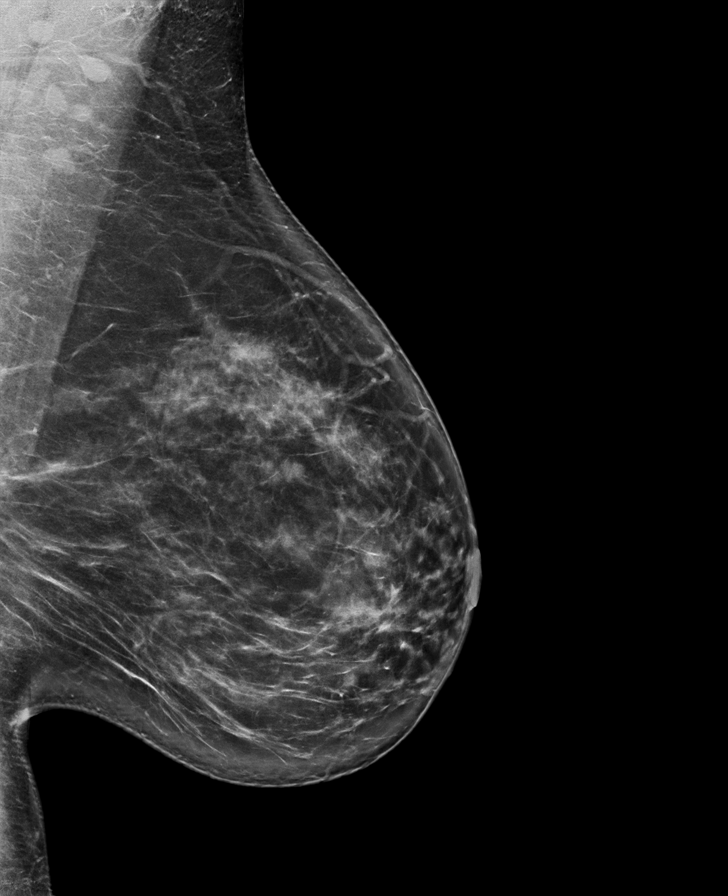

[L CC tomo · tomo slice 41/80.0]
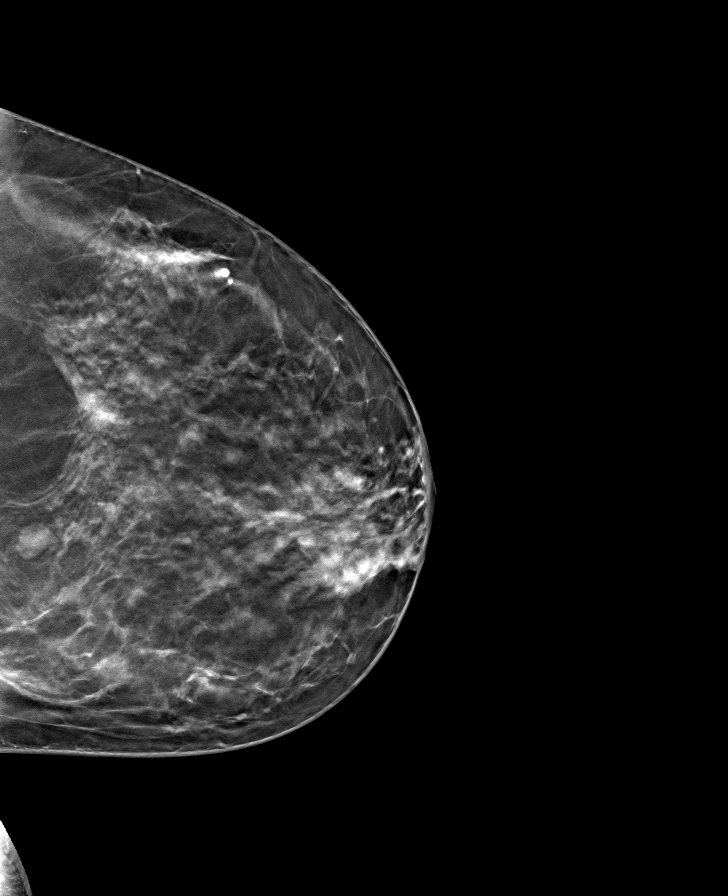

[R MLO tomo · tomo slice 45/88.0]
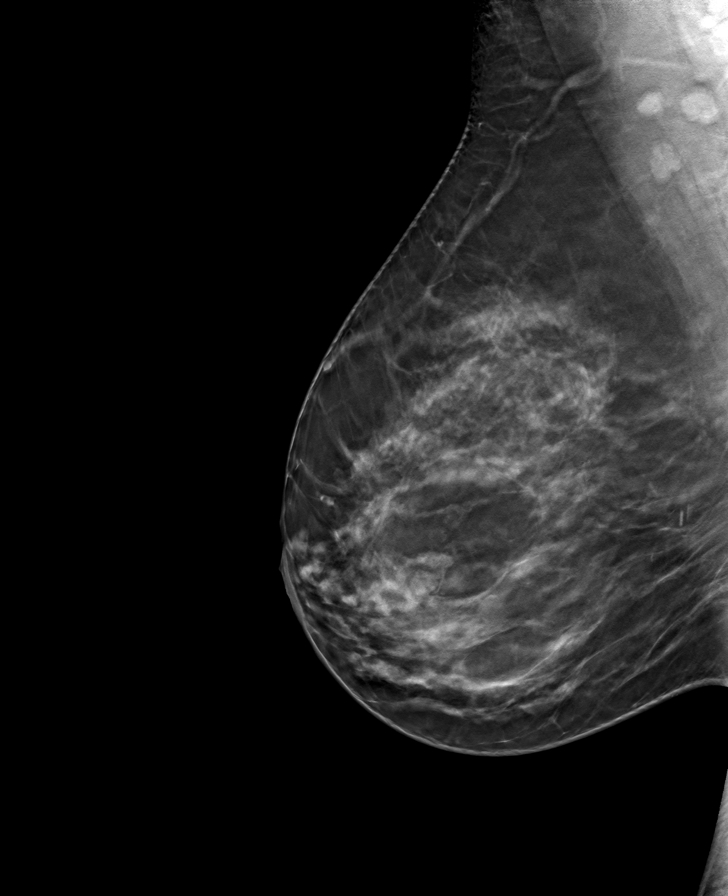

[L MLO tomo · tomo slice 47/94.0]
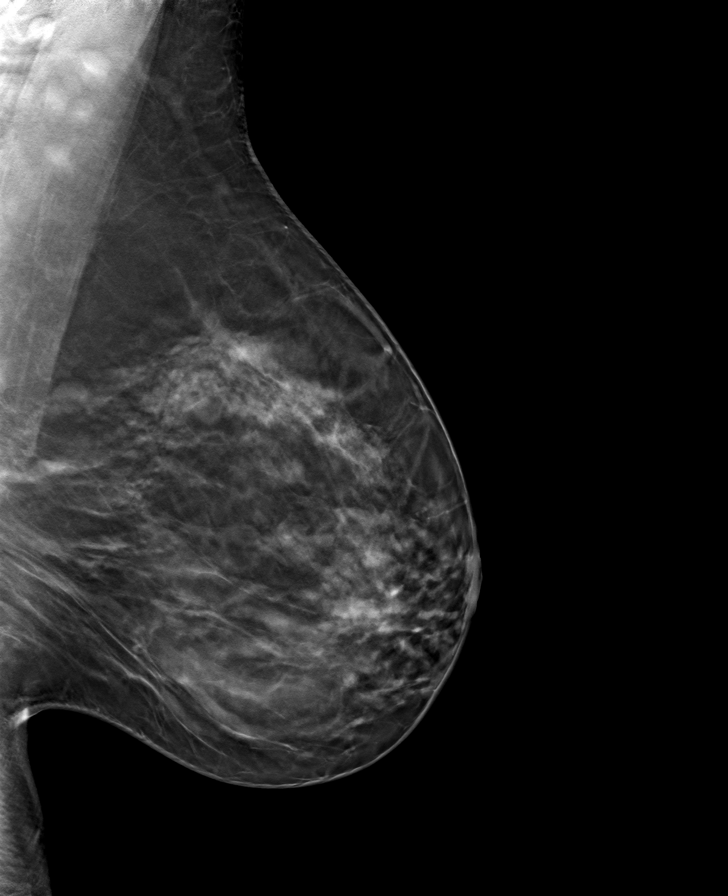

[R CC tomo · tomo slice 41/80.0]
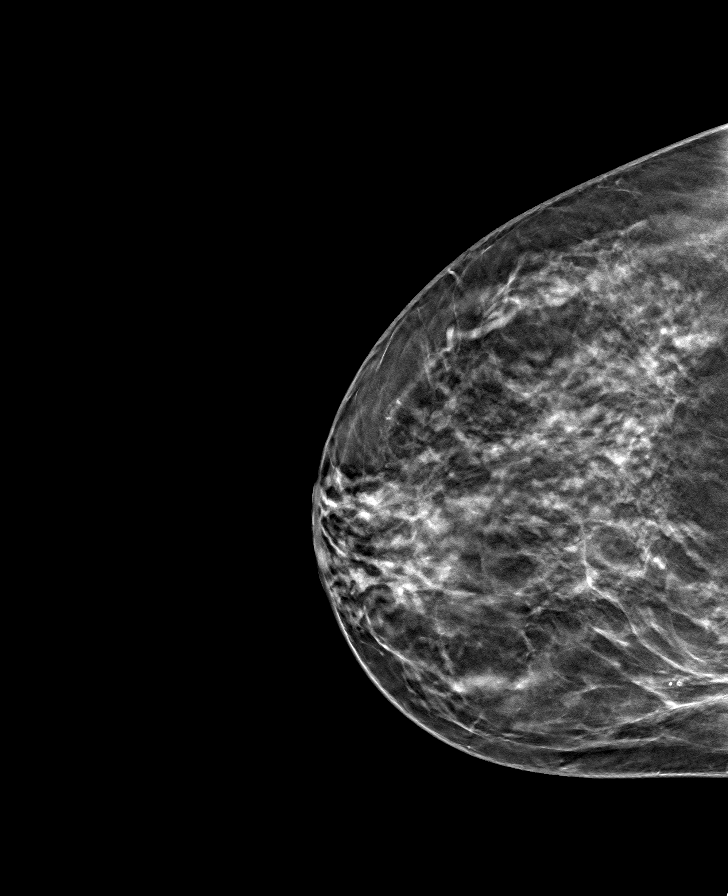

[8 of 24 positions shown; findings below may reference images not displayed]

ACR Breast Density Category c: The breast tissue is heterogeneously
dense, which may obscure small masses.
FINDINGS: There are no findings suspicious for malignancy.
IMPRESSION: No mammographic evidence of malignancy. A result letter of this
screening mammogram will be mailed directly to the patient.

RECOMMENDATION:
Screening mammogram in one year. (Code:Q3-W-BC3)

BI-RADS CATEGORY  1: Negative.

## 2023-08-01 ENCOUNTER — Other Ambulatory Visit: Payer: Self-pay | Admitting: Obstetrics and Gynecology

## 2023-08-01 DIAGNOSIS — Z3041 Encounter for surveillance of contraceptive pills: Secondary | ICD-10-CM

## 2023-08-01 DIAGNOSIS — N921 Excessive and frequent menstruation with irregular cycle: Secondary | ICD-10-CM

## 2023-08-16 ENCOUNTER — Other Ambulatory Visit: Payer: Self-pay | Admitting: Obstetrics and Gynecology

## 2023-08-16 DIAGNOSIS — Z1231 Encounter for screening mammogram for malignant neoplasm of breast: Secondary | ICD-10-CM

## 2023-09-11 NOTE — Progress Notes (Unsigned)
 PCP:  Patient, No Pcp Per   No chief complaint on file.    HPI:      Ms. Isabel Humphrey is a 43 y.o. G1P1001 whose LMP was No LMP recorded., presents today for her annual examination.  Her menses are regular every 28-30 days, lasting 3-4 days.  Dysmenorrhea none. Has spotting 3rd wk of levora 1.5/30 pill pack some months without late/missed pills then has menses on 4th wk.   Sex activity: single partner, contraception - OCP (estrogen/progesterone). No pain/bleeding. Last Pap: 08/28/22 Results were: no abnormalities /neg HPV DNA  Last mammogram: 09/27/22 Results were: normal--routine follow-up in 12 months There is a FH of breast cancer in her PGM and prostate cancer in her dad and pat uncle; genetic testing not done. There is no FH of ovarian cancer. The patient does do self-breast exams. Hx of breast cysts in past, followed by Dr. Evette Cristal.  Tobacco use: The patient denies current or previous tobacco use. Alcohol use: social drinker No drug use.  Exercise: moderately active  She does get adequate calcium but not Vitamin D in her diet. Borderlined lipids 12/22 and 3/24, due for repeat labs  Patient Active Problem List   Diagnosis Date Noted   Family history of breast cancer 08/28/2022   Elevated lipids 08/28/2022   Fibrocystic breast 02/26/2013    Past Surgical History:  Procedure Laterality Date   WISDOM TOOTH EXTRACTION      Family History  Problem Relation Age of Onset   Diabetes Mother    Cancer Mother 57       Parathyroid   Diabetes Father    Prostate cancer Father 8   Spinal muscular atrophy Maternal Uncle    Prostate cancer Paternal Uncle 3       with mets to liver, lung, and bone   Brain cancer Maternal Grandfather 34   Breast cancer Paternal Grandmother 27   Breast cancer Other        unsure of age    Social History   Socioeconomic History   Marital status: Married    Spouse name: Not on file   Number of children: 1   Years of education: 14    Highest education level: Not on file  Occupational History   Occupation: Sales executive  Tobacco Use   Smoking status: Never   Smokeless tobacco: Never  Vaping Use   Vaping status: Never Used  Substance and Sexual Activity   Alcohol use: Yes    Comment: occ glass of wine   Drug use: No   Sexual activity: Yes    Partners: Male    Birth control/protection: Pill  Other Topics Concern   Not on file  Social History Narrative   Not on file   Social Drivers of Health   Financial Resource Strain: Not on file  Food Insecurity: Not on file  Transportation Needs: Not on file  Physical Activity: Not on file  Stress: Not on file  Social Connections: Not on file  Intimate Partner Violence: Not on file     Current Outpatient Medications:    fish oil-omega-3 fatty acids 1000 MG capsule, Take 2 g by mouth daily., Disp: , Rfl:    Multiple Vitamins-Minerals (MULTIVITAMIN WITH MINERALS) tablet, Take 1 tablet by mouth daily., Disp: , Rfl:    norethindrone-ethinyl estradiol-FE (BLISOVI FE 1/20) 1-20 MG-MCG tablet, TAKE 1 TABLET BY MOUTH DAILY, Disp: 84 tablet, Rfl: 0     ROS:  Review of Systems  Constitutional:  Negative for fatigue, fever and unexpected weight change.  Respiratory:  Negative for cough, shortness of breath and wheezing.   Cardiovascular:  Negative for chest pain, palpitations and leg swelling.  Gastrointestinal:  Negative for blood in stool, constipation, diarrhea, nausea and vomiting.  Endocrine: Negative for cold intolerance, heat intolerance and polyuria.  Genitourinary:  Negative for dyspareunia, dysuria, flank pain, frequency, genital sores, hematuria, menstrual problem, pelvic pain, urgency, vaginal bleeding, vaginal discharge and vaginal pain.  Musculoskeletal:  Negative for back pain, joint swelling and myalgias.  Skin:  Negative for rash.  Neurological:  Negative for dizziness, syncope, light-headedness, numbness and headaches.  Hematological:  Negative for  adenopathy.  Psychiatric/Behavioral:  Negative for agitation, confusion, sleep disturbance and suicidal ideas. The patient is not nervous/anxious.    BREAST: No symptoms   Objective: There were no vitals taken for this visit.   Physical Exam Constitutional:      Appearance: She is well-developed.  Genitourinary:     Vulva normal.     Right Labia: No rash, tenderness or lesions.    Left Labia: No tenderness, lesions or rash.    No vaginal discharge, erythema or tenderness.      Right Adnexa: not tender and no mass present.    Left Adnexa: not tender and no mass present.    No cervical friability or polyp.     Uterus is not enlarged or tender.  Breasts:    Right: No mass, nipple discharge, skin change or tenderness.     Left: No mass, nipple discharge, skin change or tenderness.  Neck:     Thyroid: No thyromegaly.  Cardiovascular:     Rate and Rhythm: Normal rate and regular rhythm.     Heart sounds: Normal heart sounds. No murmur heard. Pulmonary:     Effort: Pulmonary effort is normal.     Breath sounds: Normal breath sounds.  Abdominal:     Palpations: Abdomen is soft.     Tenderness: There is no abdominal tenderness. There is no guarding or rebound.  Musculoskeletal:        General: Normal range of motion.     Cervical back: Normal range of motion.  Lymphadenopathy:     Cervical: No cervical adenopathy.  Neurological:     General: No focal deficit present.     Mental Status: She is alert and oriented to person, place, and time.     Cranial Nerves: No cranial nerve deficit.  Skin:    General: Skin is warm and dry.  Psychiatric:        Mood and Affect: Mood normal.        Behavior: Behavior normal.        Thought Content: Thought content normal.        Judgment: Judgment normal.  Vitals reviewed.      Assessment/Plan: Encounter for annual routine gynecological examination  Cervical cancer screening - Plan: Cytology - PAP  Screening for HPV (human  papillomavirus) - Plan: Cytology - PAP  Encounter for surveillance of contraceptive pills - Plan: norethindrone-ethinyl estradiol-FE (JUNEL FE 1/20) 1-20 MG-MCG tablet; OCP change due to BTB. F/u prn.   Breakthrough bleeding on OCPs - Plan: norethindrone-ethinyl estradiol-FE (JUNEL FE 1/20) 1-20 MG-MCG tablet; change OCPs to see if sx improve. F/u prn.   Encounter for screening mammogram for malignant neoplasm of breast - Plan: MM 3D SCREEN BREAST BILATERAL; pt to schedule mammo  Family history of breast cancer - Plan: [facility] ([facility]); [facility] testing discussed.  Pt to RTO for fasting labs and will do at that time. Will f/u with results.   Elevated lipids - Plan: Lipid panel; repeat labs.   Screening cholesterol level - Plan: Lipid panel  Blood tests for routine general physical examination - Plan: Comprehensive metabolic panel, Lipid panel  No orders of the defined types were placed in this encounter.            GYN counsel breast self exam, mammography screening, adequate intake of calcium and vitamin D, diet and exercise     F/U  No follow-ups on file.  Marcell Pfeifer B. Collin Hendley, PA-C 09/11/2023 8:54 PM

## 2023-09-12 ENCOUNTER — Encounter: Payer: Self-pay | Admitting: Obstetrics and Gynecology

## 2023-09-12 ENCOUNTER — Ambulatory Visit: Payer: Commercial Managed Care - PPO | Admitting: Obstetrics and Gynecology

## 2023-09-12 VITALS — BP 122/79 | HR 82 | Ht 67.0 in | Wt 183.0 lb

## 2023-09-12 DIAGNOSIS — E785 Hyperlipidemia, unspecified: Secondary | ICD-10-CM

## 2023-09-12 DIAGNOSIS — Z01419 Encounter for gynecological examination (general) (routine) without abnormal findings: Secondary | ICD-10-CM | POA: Diagnosis not present

## 2023-09-12 DIAGNOSIS — Z9189 Other specified personal risk factors, not elsewhere classified: Secondary | ICD-10-CM | POA: Insufficient documentation

## 2023-09-12 DIAGNOSIS — Z3041 Encounter for surveillance of contraceptive pills: Secondary | ICD-10-CM

## 2023-09-12 DIAGNOSIS — Z803 Family history of malignant neoplasm of breast: Secondary | ICD-10-CM

## 2023-09-12 DIAGNOSIS — Z1231 Encounter for screening mammogram for malignant neoplasm of breast: Secondary | ICD-10-CM

## 2023-09-12 DIAGNOSIS — Z0000 Encounter for general adult medical examination without abnormal findings: Secondary | ICD-10-CM

## 2023-09-12 MED ORDER — BLISOVI FE 1/20 1-20 MG-MCG PO TABS: 1.0000 | ORAL_TABLET | Freq: Every day | ORAL | 3 refills | Status: AC

## 2023-09-12 NOTE — Patient Instructions (Signed)
 I value your feedback and you entrusting [place] with your care. If you get a [place] patient survey, I would appreciate you taking the time to let us know about your experience today. Thank you! ? ? ?

## 2023-09-13 ENCOUNTER — Ambulatory Visit
Admission: RE | Admit: 2023-09-13 | Discharge: 2023-09-13 | Disposition: A | Discharge status: Home / Self Care | Payer: Commercial Managed Care - PPO | Source: Ambulatory Visit | Attending: Obstetrics and Gynecology | Admitting: Obstetrics and Gynecology

## 2023-09-13 DIAGNOSIS — Z1231 Encounter for screening mammogram for malignant neoplasm of breast: Secondary | ICD-10-CM | POA: Diagnosis present

## 2023-09-17 ENCOUNTER — Encounter: Payer: Self-pay | Admitting: Obstetrics and Gynecology

## 2023-09-20 ENCOUNTER — Other Ambulatory Visit

## 2023-09-20 DIAGNOSIS — Z0000 Encounter for general adult medical examination without abnormal findings: Secondary | ICD-10-CM

## 2023-09-20 DIAGNOSIS — E785 Hyperlipidemia, unspecified: Secondary | ICD-10-CM

## 2023-09-21 ENCOUNTER — Encounter: Payer: Self-pay | Admitting: Obstetrics and Gynecology

## 2023-09-21 LAB — COMPREHENSIVE METABOLIC PANEL
ALT: 20 IU/L (ref 0–32)
AST: 20 IU/L (ref 0–40)
Albumin: 4.1 g/dL (ref 3.9–4.9)
Alkaline Phosphatase: 64 IU/L (ref 44–121)
BUN/Creatinine Ratio: 17 (ref 9–23)
BUN: 14 mg/dL (ref 6–24)
Bilirubin Total: 0.6 mg/dL (ref 0.0–1.2)
CO2: 23 mmol/L (ref 20–29)
Calcium: 9.4 mg/dL (ref 8.7–10.2)
Chloride: 105 mmol/L (ref 96–106)
Creatinine, Ser: 0.82 mg/dL (ref 0.57–1.00)
Globulin, Total: 2.3 g/dL (ref 1.5–4.5)
Glucose: 84 mg/dL (ref 70–99)
Potassium: 4.1 mmol/L (ref 3.5–5.2)
Sodium: 140 mmol/L (ref 134–144)
Total Protein: 6.4 g/dL (ref 6.0–8.5)
eGFR: 92 mL/min/{1.73_m2} (ref >59)

## 2023-09-21 LAB — LIPID PANEL
Chol/HDL Ratio: 4.3 ratio (ref 0.0–4.4)
Cholesterol, Total: 200 mg/dL — ABNORMAL HIGH (ref 100–199)
HDL: 46 mg/dL (ref >39)
LDL Chol Calc (NIH): 140 mg/dL — ABNORMAL HIGH (ref 0–99)
Triglycerides: 77 mg/dL (ref 0–149)
VLDL Cholesterol Cal: 14 mg/dL (ref 5–40)

## 2024-04-16 ENCOUNTER — Encounter: Payer: Self-pay | Admitting: Obstetrics and Gynecology
